# Patient Record
Sex: Male | Born: 1940
Health system: Southern US, Community
[De-identification: ages and names within clinical notes are randomized; demographics above are authoritative.]

## PROBLEM LIST (undated history)

## (undated) DIAGNOSIS — H269 Unspecified cataract: Secondary | ICD-10-CM

## (undated) DIAGNOSIS — R011 Cardiac murmur, unspecified: Secondary | ICD-10-CM

## (undated) DIAGNOSIS — D126 Benign neoplasm of colon, unspecified: Secondary | ICD-10-CM

## (undated) DIAGNOSIS — H3530 Unspecified macular degeneration: Secondary | ICD-10-CM

## (undated) DIAGNOSIS — E785 Hyperlipidemia, unspecified: Secondary | ICD-10-CM

## (undated) DIAGNOSIS — I341 Nonrheumatic mitral (valve) prolapse: Secondary | ICD-10-CM

## (undated) DIAGNOSIS — K635 Polyp of colon: Secondary | ICD-10-CM

## (undated) DIAGNOSIS — I2510 Atherosclerotic heart disease of native coronary artery without angina pectoris: Secondary | ICD-10-CM

## (undated) DIAGNOSIS — K3189 Other diseases of stomach and duodenum: Secondary | ICD-10-CM

## (undated) DIAGNOSIS — I10 Essential (primary) hypertension: Secondary | ICD-10-CM

## (undated) DIAGNOSIS — M545 Low back pain, unspecified: Secondary | ICD-10-CM

## (undated) HISTORY — DX: Low back pain, unspecified: M54.50

## (undated) HISTORY — PX: COLONOSCOPY: SHX174

## (undated) HISTORY — DX: Essential (primary) hypertension: I10

## (undated) HISTORY — DX: Unspecified cataract: H26.9

## (undated) HISTORY — DX: Atherosclerotic heart disease of native coronary artery without angina pectoris: I25.10

## (undated) HISTORY — DX: Other diseases of stomach and duodenum: K31.89

## (undated) HISTORY — DX: Benign neoplasm of colon, unspecified: D12.6

## (undated) HISTORY — DX: Low back pain: M54.5

## (undated) HISTORY — PX: APPENDECTOMY: SHX54

## (undated) HISTORY — DX: Nonrheumatic mitral (valve) prolapse: I34.1

## (undated) HISTORY — DX: Hyperlipidemia, unspecified: E78.5

## (undated) HISTORY — DX: Unspecified macular degeneration: H35.30

## (undated) HISTORY — DX: Polyp of colon: K63.5

## (undated) HISTORY — DX: Cardiac murmur, unspecified: R01.1

---

## 2001-07-16 HISTORY — PX: OTHER SURGICAL HISTORY: SHX169

## 2001-07-16 HISTORY — PX: CARDIAC CATHETERIZATION: SHX172

## 2001-12-13 ENCOUNTER — Emergency Department (HOSPITAL_COMMUNITY): Admission: EM | Admit: 2001-12-13 | Discharge: 2001-12-14 | Payer: Self-pay | Admitting: Emergency Medicine

## 2001-12-13 ENCOUNTER — Encounter: Payer: Self-pay | Admitting: Emergency Medicine

## 2002-05-13 ENCOUNTER — Ambulatory Visit (HOSPITAL_COMMUNITY): Admission: RE | Admit: 2002-05-13 | Discharge: 2002-05-13 | Payer: Self-pay | Admitting: Cardiology

## 2002-05-13 ENCOUNTER — Encounter: Payer: Self-pay | Admitting: Cardiology

## 2002-05-14 ENCOUNTER — Encounter: Payer: Self-pay | Admitting: Cardiology

## 2002-05-14 ENCOUNTER — Ambulatory Visit (HOSPITAL_COMMUNITY): Admission: RE | Admit: 2002-05-14 | Discharge: 2002-05-15 | Payer: Self-pay | Admitting: Cardiology

## 2002-06-17 ENCOUNTER — Encounter: Admission: RE | Admit: 2002-06-17 | Discharge: 2002-06-17 | Payer: Self-pay | Admitting: Internal Medicine

## 2002-06-17 ENCOUNTER — Encounter: Payer: Self-pay | Admitting: Internal Medicine

## 2002-07-16 HISTORY — PX: MITRAL VALVE REPAIR: SHX2039

## 2005-01-18 ENCOUNTER — Ambulatory Visit: Payer: Self-pay | Admitting: Internal Medicine

## 2005-02-14 ENCOUNTER — Ambulatory Visit: Payer: Self-pay | Admitting: Internal Medicine

## 2005-07-19 ENCOUNTER — Ambulatory Visit: Payer: Self-pay | Admitting: Internal Medicine

## 2005-07-27 ENCOUNTER — Ambulatory Visit: Payer: Self-pay | Admitting: Internal Medicine

## 2006-01-15 ENCOUNTER — Encounter: Payer: Self-pay | Admitting: Internal Medicine

## 2006-01-15 ENCOUNTER — Ambulatory Visit: Payer: Self-pay | Admitting: Internal Medicine

## 2006-01-15 ENCOUNTER — Ambulatory Visit: Payer: Self-pay

## 2006-06-14 ENCOUNTER — Ambulatory Visit: Payer: Self-pay | Admitting: Internal Medicine

## 2006-06-14 LAB — CONVERTED CEMR LAB
ALT: 23 units/L (ref 0–40)
AST: 20 units/L (ref 0–37)
Albumin: 4.2 g/dL (ref 3.5–5.2)
Alkaline Phosphatase: 64 units/L (ref 39–117)
BUN: 12 mg/dL (ref 6–23)
Basophils Absolute: 0 10*3/uL (ref 0.0–0.1)
Basophils Relative: 0.8 % (ref 0.0–1.0)
CO2: 32 meq/L (ref 19–32)
Calcium: 9.4 mg/dL (ref 8.4–10.5)
Chloride: 104 meq/L (ref 96–112)
Chol/HDL Ratio, serum: 3
Cholesterol: 148 mg/dL (ref 0–200)
Creatinine, Ser: 0.9 mg/dL (ref 0.4–1.5)
Eosinophil percent: 1.9 % (ref 0.0–5.0)
GFR calc non Af Amer: 90 mL/min
Glomerular Filtration Rate, Af Am: 109 mL/min/{1.73_m2}
Glucose, Bld: 89 mg/dL (ref 70–99)
HCT: 47.1 % (ref 39.0–52.0)
HDL: 48.9 mg/dL (ref >39.0)
Hemoglobin: 15.6 g/dL (ref 13.0–17.0)
LDL Cholesterol: 83 mg/dL (ref 0–99)
Lymphocytes Relative: 26.8 % (ref 12.0–46.0)
MCHC: 33.1 g/dL (ref 30.0–36.0)
MCV: 92.1 fL (ref 78.0–100.0)
Monocytes Absolute: 0.5 10*3/uL (ref 0.2–0.7)
Monocytes Relative: 9.6 % (ref 3.0–11.0)
Neutro Abs: 3.2 10*3/uL (ref 1.4–7.7)
Neutrophils Relative %: 60.9 % (ref 43.0–77.0)
PSA: 0.84 ng/mL (ref 0.10–4.00)
Platelets: 199 10*3/uL (ref 150–400)
Potassium: 4.4 meq/L (ref 3.5–5.1)
RBC: 5.12 M/uL (ref 4.22–5.81)
RDW: 13.1 % (ref 11.5–14.6)
Sodium: 142 meq/L (ref 135–145)
TSH: 1.39 microintl units/mL (ref 0.35–5.50)
Total Bilirubin: 0.8 mg/dL (ref 0.3–1.2)
Total Protein: 6.5 g/dL (ref 6.0–8.3)
Triglyceride fasting, serum: 81 mg/dL (ref 0–149)
VLDL: 16 mg/dL (ref 0–40)
WBC: 5.2 10*3/uL (ref 4.5–10.5)

## 2006-08-12 ENCOUNTER — Ambulatory Visit: Payer: Self-pay | Admitting: Internal Medicine

## 2007-01-22 ENCOUNTER — Telehealth: Payer: Self-pay | Admitting: Internal Medicine

## 2007-01-24 ENCOUNTER — Ambulatory Visit: Payer: Self-pay | Admitting: Internal Medicine

## 2007-01-24 LAB — CONVERTED CEMR LAB
Bilirubin Urine: NEGATIVE
Blood in Urine, dipstick: NEGATIVE
Glucose, Urine, Semiquant: NEGATIVE
Ketones, urine, test strip: NEGATIVE
Nitrite: NEGATIVE
Protein, U semiquant: NEGATIVE
Specific Gravity, Urine: >=1.03
Urobilinogen, UA: NEGATIVE
WBC Urine, dipstick: NEGATIVE
pH: 5

## 2007-01-29 ENCOUNTER — Encounter: Payer: Self-pay | Admitting: Internal Medicine

## 2007-01-30 ENCOUNTER — Ambulatory Visit: Payer: Self-pay | Admitting: Gastroenterology

## 2007-03-26 ENCOUNTER — Ambulatory Visit: Payer: Self-pay | Admitting: Internal Medicine

## 2007-03-26 DIAGNOSIS — M545 Low back pain, unspecified: Secondary | ICD-10-CM | POA: Insufficient documentation

## 2007-03-28 LAB — CONVERTED CEMR LAB: PSA: 1.01 ng/mL (ref 0.10–4.00)

## 2007-04-02 ENCOUNTER — Encounter: Payer: Self-pay | Admitting: Internal Medicine

## 2007-04-02 ENCOUNTER — Encounter: Payer: Self-pay | Admitting: Gastroenterology

## 2007-04-02 ENCOUNTER — Ambulatory Visit: Payer: Self-pay | Admitting: Gastroenterology

## 2007-05-05 ENCOUNTER — Ambulatory Visit: Payer: Self-pay | Admitting: Internal Medicine

## 2007-05-05 LAB — CONVERTED CEMR LAB
ALT: 20 units/L (ref 0–53)
AST: 21 units/L (ref 0–37)
Cholesterol: 135 mg/dL (ref 0–200)
HDL: 45.7 mg/dL (ref >39.0)
LDL Cholesterol: 72 mg/dL (ref 0–99)
Total CHOL/HDL Ratio: 3
Triglycerides: 86 mg/dL (ref 0–149)
VLDL: 17 mg/dL (ref 0–40)

## 2007-07-03 DIAGNOSIS — D126 Benign neoplasm of colon, unspecified: Secondary | ICD-10-CM | POA: Insufficient documentation

## 2007-07-03 DIAGNOSIS — H3530 Unspecified macular degeneration: Secondary | ICD-10-CM | POA: Insufficient documentation

## 2007-07-03 DIAGNOSIS — E785 Hyperlipidemia, unspecified: Secondary | ICD-10-CM | POA: Insufficient documentation

## 2007-09-10 ENCOUNTER — Encounter: Payer: Self-pay | Admitting: Internal Medicine

## 2007-10-17 ENCOUNTER — Ambulatory Visit: Payer: Self-pay | Admitting: Internal Medicine

## 2007-10-17 LAB — CONVERTED CEMR LAB
ALT: 17 units/L (ref 0–53)
AST: 18 units/L (ref 0–37)

## 2007-10-27 ENCOUNTER — Telehealth: Payer: Self-pay | Admitting: Internal Medicine

## 2007-10-30 ENCOUNTER — Ambulatory Visit: Payer: Self-pay | Admitting: Internal Medicine

## 2007-10-30 DIAGNOSIS — I10 Essential (primary) hypertension: Secondary | ICD-10-CM | POA: Insufficient documentation

## 2007-11-01 LAB — CONVERTED CEMR LAB
BUN: 14 mg/dL (ref 6–23)
Basophils Absolute: 0 10*3/uL (ref 0.0–0.1)
Basophils Relative: 0.2 % (ref 0.0–1.0)
CO2: 32 meq/L (ref 19–32)
Calcium: 9.4 mg/dL (ref 8.4–10.5)
Chloride: 108 meq/L (ref 96–112)
Creatinine, Ser: 0.9 mg/dL (ref 0.4–1.5)
Eosinophils Absolute: 0.1 10*3/uL (ref 0.0–0.7)
Eosinophils Relative: 3.1 % (ref 0.0–5.0)
GFR calc Af Amer: 109 mL/min
GFR calc non Af Amer: 90 mL/min
Glucose, Bld: 85 mg/dL (ref 70–99)
HCT: 45.2 % (ref 39.0–52.0)
Hemoglobin: 15.6 g/dL (ref 13.0–17.0)
Lymphocytes Relative: 26.7 % (ref 12.0–46.0)
MCHC: 34.5 g/dL (ref 30.0–36.0)
MCV: 91 fL (ref 78.0–100.0)
Monocytes Absolute: 0.5 10*3/uL (ref 0.1–1.0)
Monocytes Relative: 10.2 % (ref 3.0–12.0)
Neutro Abs: 2.9 10*3/uL (ref 1.4–7.7)
Neutrophils Relative %: 59.8 % (ref 43.0–77.0)
PSA: 0.88 ng/mL (ref 0.10–4.00)
Platelets: 195 10*3/uL (ref 150–400)
Potassium: 4.8 meq/L (ref 3.5–5.1)
RBC: 4.96 M/uL (ref 4.22–5.81)
RDW: 13.5 % (ref 11.5–14.6)
Sodium: 143 meq/L (ref 135–145)
TSH: 1.01 microintl units/mL (ref 0.35–5.50)
WBC: 4.8 10*3/uL (ref 4.5–10.5)

## 2007-11-03 ENCOUNTER — Encounter (INDEPENDENT_AMBULATORY_CARE_PROVIDER_SITE_OTHER): Payer: Self-pay | Admitting: *Deleted

## 2007-11-24 ENCOUNTER — Ambulatory Visit: Payer: Self-pay | Admitting: Cardiology

## 2007-11-24 ENCOUNTER — Ambulatory Visit: Payer: Self-pay

## 2008-01-15 ENCOUNTER — Telehealth (INDEPENDENT_AMBULATORY_CARE_PROVIDER_SITE_OTHER): Payer: Self-pay | Admitting: *Deleted

## 2008-05-03 ENCOUNTER — Ambulatory Visit: Payer: Self-pay | Admitting: Internal Medicine

## 2008-05-03 LAB — CONVERTED CEMR LAB
ALT: 19 units/L (ref 0–53)
AST: 20 units/L (ref 0–37)

## 2008-05-18 ENCOUNTER — Ambulatory Visit: Payer: Self-pay | Admitting: Internal Medicine

## 2008-05-18 LAB — CONVERTED CEMR LAB
Cholesterol: 147 mg/dL (ref 0–200)
HDL: 50.3 mg/dL (ref >39.0)
LDL Cholesterol: 73 mg/dL (ref 0–99)
TSH: 1.41 microintl units/mL (ref 0.35–5.50)
Total CHOL/HDL Ratio: 2.9
Triglycerides: 121 mg/dL (ref 0–149)
VLDL: 24 mg/dL (ref 0–40)

## 2008-10-25 ENCOUNTER — Ambulatory Visit: Payer: Self-pay | Admitting: Internal Medicine

## 2008-11-06 LAB — CONVERTED CEMR LAB
ALT: 21 units/L (ref 0–53)
AST: 20 units/L (ref 0–37)
Cholesterol: 137 mg/dL (ref 0–200)
HDL: 42.4 mg/dL (ref >39.00)
LDL Cholesterol: 80 mg/dL (ref 0–99)
Total CHOL/HDL Ratio: 3
Triglycerides: 73 mg/dL (ref 0.0–149.0)
VLDL: 14.6 mg/dL (ref 0.0–40.0)

## 2008-12-22 ENCOUNTER — Ambulatory Visit: Payer: Self-pay | Admitting: Internal Medicine

## 2008-12-22 DIAGNOSIS — N400 Enlarged prostate without lower urinary tract symptoms: Secondary | ICD-10-CM | POA: Insufficient documentation

## 2008-12-27 LAB — CONVERTED CEMR LAB
BUN: 15 mg/dL (ref 6–23)
Basophils Absolute: 0 10*3/uL (ref 0.0–0.1)
Basophils Relative: 0 % (ref 0.0–3.0)
CO2: 28 meq/L (ref 19–32)
Calcium: 9.1 mg/dL (ref 8.4–10.5)
Chloride: 112 meq/L (ref 96–112)
Creatinine, Ser: 0.9 mg/dL (ref 0.4–1.5)
Eosinophils Absolute: 0.2 10*3/uL (ref 0.0–0.7)
Eosinophils Relative: 3.2 % (ref 0.0–5.0)
GFR calc non Af Amer: 89.27 mL/min (ref >60)
Glucose, Bld: 87 mg/dL (ref 70–99)
HCT: 44 % (ref 39.0–52.0)
Hemoglobin: 14.9 g/dL (ref 13.0–17.0)
Lymphocytes Relative: 29.4 % (ref 12.0–46.0)
Lymphs Abs: 1.5 10*3/uL (ref 0.7–4.0)
MCHC: 33.9 g/dL (ref 30.0–36.0)
MCV: 93 fL (ref 78.0–100.0)
Monocytes Absolute: 0.3 10*3/uL (ref 0.1–1.0)
Monocytes Relative: 6.6 % (ref 3.0–12.0)
Neutro Abs: 3 10*3/uL (ref 1.4–7.7)
Neutrophils Relative %: 60.8 % (ref 43.0–77.0)
PSA: 1.13 ng/mL (ref 0.10–4.00)
Platelets: 185 10*3/uL (ref 150.0–400.0)
Potassium: 4.4 meq/L (ref 3.5–5.1)
RBC: 4.73 M/uL (ref 4.22–5.81)
RDW: 13.2 % (ref 11.5–14.6)
Sodium: 144 meq/L (ref 135–145)
TSH: 1.26 microintl units/mL (ref 0.35–5.50)
WBC: 5 10*3/uL (ref 4.5–10.5)

## 2008-12-28 ENCOUNTER — Telehealth (INDEPENDENT_AMBULATORY_CARE_PROVIDER_SITE_OTHER): Payer: Self-pay | Admitting: *Deleted

## 2008-12-30 ENCOUNTER — Telehealth (INDEPENDENT_AMBULATORY_CARE_PROVIDER_SITE_OTHER): Payer: Self-pay | Admitting: *Deleted

## 2009-02-04 ENCOUNTER — Ambulatory Visit: Payer: Self-pay | Admitting: Internal Medicine

## 2009-02-04 DIAGNOSIS — I2510 Atherosclerotic heart disease of native coronary artery without angina pectoris: Secondary | ICD-10-CM | POA: Insufficient documentation

## 2009-02-04 DIAGNOSIS — I059 Rheumatic mitral valve disease, unspecified: Secondary | ICD-10-CM | POA: Insufficient documentation

## 2009-07-25 ENCOUNTER — Telehealth: Payer: Self-pay | Admitting: Internal Medicine

## 2009-10-24 ENCOUNTER — Ambulatory Visit: Payer: Self-pay | Admitting: Internal Medicine

## 2009-10-25 ENCOUNTER — Encounter: Payer: Self-pay | Admitting: Internal Medicine

## 2009-10-25 LAB — CONVERTED CEMR LAB: AST: 20 units/L (ref 0–37)

## 2009-12-07 ENCOUNTER — Telehealth: Payer: Self-pay | Admitting: Internal Medicine

## 2009-12-26 ENCOUNTER — Ambulatory Visit: Payer: Self-pay | Admitting: Internal Medicine

## 2009-12-26 LAB — CONVERTED CEMR LAB
Bilirubin Urine: NEGATIVE
Blood in Urine, dipstick: NEGATIVE
Glucose, Urine, Semiquant: NEGATIVE
Ketones, urine, test strip: NEGATIVE
Nitrite: NEGATIVE
Protein, U semiquant: NEGATIVE
Specific Gravity, Urine: >=1.03
Urobilinogen, UA: NEGATIVE
WBC Urine, dipstick: NEGATIVE
pH: 6

## 2009-12-27 LAB — CONVERTED CEMR LAB
ALT: 19 units/L (ref 0–53)
AST: 19 units/L (ref 0–37)
Albumin: 4.1 g/dL (ref 3.5–5.2)
Alkaline Phosphatase: 59 units/L (ref 39–117)
BUN: 22 mg/dL (ref 6–23)
Basophils Absolute: 0 10*3/uL (ref 0.0–0.1)
Basophils Relative: 0.5 % (ref 0.0–3.0)
Bilirubin, Direct: 0.1 mg/dL (ref 0.0–0.3)
CO2: 29 meq/L (ref 19–32)
Calcium: 9.2 mg/dL (ref 8.4–10.5)
Chloride: 109 meq/L (ref 96–112)
Creatinine, Ser: 0.8 mg/dL (ref 0.4–1.5)
Eosinophils Absolute: 0.1 10*3/uL (ref 0.0–0.7)
Eosinophils Relative: 2.5 % (ref 0.0–5.0)
GFR calc non Af Amer: 106.56 mL/min (ref >60)
Glucose, Bld: 89 mg/dL (ref 70–99)
HCT: 42.7 % (ref 39.0–52.0)
Hemoglobin: 14.8 g/dL (ref 13.0–17.0)
Lymphocytes Relative: 23.6 % (ref 12.0–46.0)
Lymphs Abs: 1.1 10*3/uL (ref 0.7–4.0)
MCHC: 34.5 g/dL (ref 30.0–36.0)
MCV: 91.4 fL (ref 78.0–100.0)
Monocytes Absolute: 0.5 10*3/uL (ref 0.1–1.0)
Monocytes Relative: 10.1 % (ref 3.0–12.0)
Neutro Abs: 3.1 10*3/uL (ref 1.4–7.7)
Neutrophils Relative %: 63.3 % (ref 43.0–77.0)
PSA: 1.7 ng/mL (ref 0.10–4.00)
Platelets: 184 10*3/uL (ref 150.0–400.0)
Potassium: 5.4 meq/L — ABNORMAL HIGH (ref 3.5–5.1)
RBC: 4.67 M/uL (ref 4.22–5.81)
RDW: 14.8 % — ABNORMAL HIGH (ref 11.5–14.6)
Sodium: 145 meq/L (ref 135–145)
TSH: 0.89 microintl units/mL (ref 0.35–5.50)
Total Bilirubin: 0.8 mg/dL (ref 0.3–1.2)
Total Protein: 6 g/dL (ref 6.0–8.3)
WBC: 4.9 10*3/uL (ref 4.5–10.5)

## 2010-01-19 ENCOUNTER — Telehealth: Payer: Self-pay | Admitting: Internal Medicine

## 2010-08-03 ENCOUNTER — Encounter: Payer: Self-pay | Admitting: Internal Medicine

## 2010-08-03 ENCOUNTER — Other Ambulatory Visit: Payer: Self-pay | Admitting: Internal Medicine

## 2010-08-03 ENCOUNTER — Ambulatory Visit
Admission: RE | Admit: 2010-08-03 | Discharge: 2010-08-03 | Payer: Self-pay | Source: Home / Self Care | Attending: Internal Medicine | Admitting: Internal Medicine

## 2010-08-03 LAB — BASIC METABOLIC PANEL
BUN: 14 mg/dL (ref 6–23)
CO2: 29 mEq/L (ref 19–32)
Calcium: 8.8 mg/dL (ref 8.4–10.5)
Chloride: 101 mEq/L (ref 96–112)
Creatinine, Ser: 0.7 mg/dL (ref 0.4–1.5)
GFR: 118.73 mL/min (ref >60.00)
Glucose, Bld: 89 mg/dL (ref 70–99)
Potassium: 4.6 mEq/L (ref 3.5–5.1)
Sodium: 135 mEq/L (ref 135–145)

## 2010-08-03 LAB — LIPID PANEL
Cholesterol: 150 mg/dL (ref 0–200)
HDL: 48.8 mg/dL (ref >39.00)
LDL Cholesterol: 76 mg/dL (ref 0–99)
Total CHOL/HDL Ratio: 3
Triglycerides: 127 mg/dL (ref 0.0–149.0)
VLDL: 25.4 mg/dL (ref 0.0–40.0)

## 2010-08-03 LAB — AST: AST: 18 U/L (ref 0–37)

## 2010-08-07 ENCOUNTER — Encounter: Payer: Self-pay | Admitting: Internal Medicine

## 2010-08-15 NOTE — Assessment & Plan Note (Signed)
Summary: cpx/cbs   Vital Signs:  Patient profile:   70 year old male Height:      72.25 inches Weight:      221.2 pounds BMI:     29.90 Temp:     98.0 degrees F oral Pulse rate:   60 / minute Resp:     16 per minute BP sitting:   136 / 80  (left arm) Cuff size:   large  Vitals Entered By: Shonna Chock (December 22, 2008 1:52 PM)  Comments REVIEWED MED LIST, PATIENT AGREED DOSE AND INSTRUCTION CORRECT    History of Present Illness: Dr Charlott Rakes 4/10  note & lipids reviewed; stable from cardiac standpoint  Preventive Screening-Counseling & Management  Caffeine-Diet-Exercise     Does Patient Exercise: yes  Allergies: 1)  ! * Altace  Past History:  Past Medical History: MACULAR DEGENERATION, EARLY (ICD-362.50), [facility] , Dr Gwendalyn Ege MITRAL VALVE REPAIRED 2003 COLONIC POLYPS (ICD-211.3) @ 1st colonoscopy; neg X 2 HYPERLIPIDEMIA (ICD-272.4) LOW BACK PAIN, CHRONIC (ICD-724.2)  Past Surgical History: Appendectomy colonoscopy negative 2003, 2008, Dr Alma Downs 2003: 40-60% LAD , cath done pre MV repair;mitral valve repair  04/2002 in [place], [place], Dr Janey Genta  Family History: father cancer on lip, heart disease, prostate cancer mother hypertension?, d @ 42; no sibs; P uncle Macular Degeneration  Social History: Former Smoker quit 1980 Alcohol use-yes socially No diet Occupation: Biomedical scientist Married Regular exercise-yes; 1 mpd 2-3 X/week  Review of Systems  The patient denies anorexia, fever, weight loss, weight gain, decreased hearing, hoarseness, chest pain, syncope, dyspnea on exertion, peripheral edema, prolonged cough, headaches, hemoptysis, abdominal pain, melena, hematochezia, severe indigestion/heartburn, hematuria, depression, unusual weight change, abnormal bleeding, enlarged lymph nodes, and angioedema.         Initial dyspnea with walking which resolves quickly. No PMH of asthma. Derm:  Complains of lesion(s); denies changes in nail beds, dryness, and hair  loss; Lesion RLE had enlarged after he had peeled of dead skin 2 yrs ago; essen stable for 18 months. Dr Donzetta Starch saw it last year.Marland Kitchen  Physical Exam  General:  well-nourished,in no acute distress; alert,appropriate and cooperative throughout examination Head:  Normocephalic and atraumatic without obvious abnormalities. No apparent alopecia Eyes:  No corneal or conjunctival inflammation noted. Perrla. Ears:  External ear exam shows no significant lesions or deformities.  Otoscopic examination reveals clear canals, tympanic membranes are intact bilaterally without bulging, retraction, inflammation or discharge. Hearing is grossly normal bilaterally. Nose:  External nasal examination shows no deformity or inflammation. Nasal mucosa are pink and moist without lesions or exudates. Mouth:  Oral mucosa and oropharynx without lesions or exudates.  Teeth in good repair. Neck:  No deformities, masses, or tenderness noted. Lungs:  Normal respiratory effort, chest expands symmetrically. Lungs are clear to auscultation, no crackles or wheezes. Heart:  Normal rate and regular rhythm. S1 and S2 normal without gallop, murmur, click, rub. S4 with minor slurring Abdomen:  Bowel sounds positive,abdomen soft and non-tender without masses, organomegaly or hernias noted. Rectal:  No external abnormalities noted. Normal sphincter tone. No rectal masses or tenderness. Genitalia:  Testes bilaterally descended without nodularity, tenderness or masses. No scrotal masses or lesions. No penis lesions or urethral discharge. Prostate:  Prostate gland firm and smooth,ULN enlargement, w/o nodularity, tenderness, mass, asymmetry or induration. Msk:  No deformity or scoliosis noted of thoracic or lumbar spine.   Pulses:  R and L carotid,radial,dorsalis pedis and posterior tibial pulses are full and equal bilaterally Extremities:  No clubbing, cyanosis, edema, or deformity noted with normal full range of motion of all joints.   Minor crepitus of knees  Neurologic:  alert & oriented X3 and DTRs symmetrical and normal.   Skin:  9X11 mm granuloma R medial calf Cervical Nodes:  No lymphadenopathy noted Axillary Nodes:  No palpable lymphadenopathy Psych:  memory intact for recent and remote, normally interactive, and good eye contact.     Impression & Recommendations:  Problem # 1:  ROUTINE GENERAL MEDICAL EXAM@HEALTH  CARE FACL (ICD-V70.0)  Orders: Venipuncture (04540) TLB-BMP (Basic Metabolic Panel-BMET) (80048-METABOL) TLB-CBC Platelet - w/Differential (85025-CBCD) TLB-TSH (Thyroid Stimulating Hormone) (84443-TSH) TLB-PSA (Prostate Specific Antigen) (84153-PSA)  Problem # 2:  HYPERTENSION, ESSENTIAL NOS (ICD-401.9)  His updated medication list for this problem includes:    Metoprolol Tartrate 25 Mg Tabs (Metoprolol tartrate) .Marland Kitchen... 1 by mouth two times a day    Amlodipine Besylate 5 Mg Tabs (Amlodipine besylate) .Marland Kitchen... 1 by mouth once daily  Orders: Venipuncture (98119)  Problem # 3:  HYPERLIPIDEMIA (ICD-272.4)  His updated medication list for this problem includes:    Lipitor 80 Mg Tabs (Atorvastatin calcium) .Marland Kitchen... 1 by mouth qd  Orders: Venipuncture (14782)  Problem # 4:  MITRAL VALVE REPLACEMENT, HX OF (ICD-V15.1) as per Dr Tenny Craw  Problem # 5:  COLONIC POLYPS (ICD-211.3) as per Dr Jarold Motto  Problem # 6:  HYPERPLASIA PROSTATE UNS W/O UR OBST & OTH LUTS (ICD-600.90)  Orders: Venipuncture (95621) TLB-PSA (Prostate Specific Antigen) (84153-PSA)  Complete Medication List: 1)  Lipitor 80 Mg Tabs (Atorvastatin calcium) .Marland Kitchen.. 1 by mouth qd 2)  Metoprolol Tartrate 25 Mg Tabs (Metoprolol tartrate) .Marland Kitchen.. 1 by mouth two times a day 3)  Asa 325 Mg I Po Qd  4)  Saw Palmetto  5)  Antioxident For Eyes  6)  Amlodipine Besylate 5 Mg Tabs (Amlodipine besylate) .Marland Kitchen.. 1 by mouth once daily  Patient Instructions: 1)  Make additions & corrections to this record ; save in home file. Prescriptions: METOPROLOL  TARTRATE 25 MG TABS (METOPROLOL TARTRATE) 1 by mouth two times a day  #180 x 3   Entered and Authorized by:   Marga Melnick MD   Signed by:   Marga Melnick MD on 12/22/2008   Method used:   Print then Give to Patient   RxID:   [id number]

## 2010-08-15 NOTE — Progress Notes (Signed)
   Phone Note Call from Patient   Caller: Jeremy Klein Summary of Call: ALEVE for back pain Initial call taken by: J REISS RN  Follow-up for Phone Call        Patient called and informed me that he is seeing Dr.Phil Montez Morita for low back pain and was advised to take Alleve (2 tabs 3 times per day with meals). He wanted to know if Dr.Vergie Zahm was OK with this. Advised him that it would probably be OK but I will check with Dr.Korbyn Vanes and call him back at work tomorrow. ([phone]) Follow-up by: Suzan Garibaldi RN  Additional Follow-up for Phone Call Additional follow up Details #1::        yes that should be ok.  Watch for GI upset. Additional Follow-up by: Sherrill Raring, MD, [facility],  Dec 07, 2009 11:16 AM     Appended Document:  Called patient with above information.

## 2010-08-15 NOTE — Assessment & Plan Note (Signed)
Summary: cpx/tl/see lab 040309/cbs   Vital Signs:  Patient Profile:   70 Years Old Male Weight:      217.38 pounds Pulse rate:   52 / minute Pulse rhythm:   regular Resp:     15 per minute BP sitting:   130 / 82  (left arm) Cuff size:   large  Pt. in pain?   no  Vitals Entered By: Wendall Stade (October 30, 2007 8:37 AM)                   Chief Complaint:  cpx.  History of Present Illness:   He saw Dr. Tenny Craw and had EKG on 10/17/2007 and several labs were done(see printout). Amlodipine added 10/17/07.  General Medical HPI:        Currently he is doing well and is not having any significant new complaints.    Current Allergies (reviewed today): ! * ALTACE  Past Medical History:    Current Problems:     MACULAR DEGENERATION, EARLY (ICD-362.50)    MITRAL VALVE REPAIRED 2003    COLONIC POLYPS (ICD-211.3) @ 1st colonoscopy; neg X 2    HYPERLIPIDEMIA (ICD-272.4)    LOW BACK PAIN, CHRONIC (ICD-724.2)  Past Surgical History:    Appendectomy    colonoscopy negative 2003, 2008    mitral valve repair  04/2002   Family History:    Reviewed history and no changes required:       father cancer on lip, heart disease, prostate cancer       mother hypertension?  Social History:    Reviewed history and no changes required:       Former Smoker quit 1980       Alcohol use-yes social       No diet   Risk Factors:  Tobacco use:  quit    Year quit:  1980 Alcohol use:  yes    Type:  socially Exercise:  yes    Type:  walks dog ;no regular CVE   Review of Systems  General      Denies chills, fatigue, fever, sleep disorder, weakness, and weight loss.  Eyes      Denies blurring, double vision, eye pain, and vision loss-both eyes.      Seen @ [facility] for early MD  ENT      Denies decreased hearing, difficulty swallowing, earache, nasal congestion, ringing in ears, and sinus pressure.  CV      Denies bluish discoloration of lips or nails and leg cramps with  exertion.  Resp      Denies cough, excessive snoring, hypersomnolence, morning headaches, shortness of breath, and sputum productive.  GI      Denies abdominal pain, bloody stools, change in bowel habits, constipation, dark tarry stools, diarrhea, indigestion, nausea, and vomiting.      No dysphagia; colonoscopy < 6 mos ago  GU      Complains of nocturia.      Denies decreased libido, discharge, dysuria, erectile dysfunction, hematuria, and urinary frequency.      X1 @ 4-5 am  MS      Complains of low back pain.      Denies joint pain, joint redness, joint swelling, loss of strength, mid back pain, muscle aches, cramps, muscle weakness, stiffness, and thoracic pain.  Derm      Denies changes in color of skin, changes in nail beds, dryness, excessive perspiration, flushing, hair loss, itching, lesion(s), poor wound healing, and rash.  He will see Dr  Donzetta Starch   Neuro      Denies difficulty with concentration, disturbances in coordination, memory loss, numbness, poor balance, sensation of room spinning, and tingling.  Psych      Denies anxiety, depression, easily angered, easily tearful, and irritability.  Endo      Denies cold intolerance, excessive hunger, excessive thirst, excessive urination, heat intolerance, polyuria, and weight change.  Heme      Denies abnormal bruising and bleeding.  Allergy      Denies itching eyes, seasonal allergies, and sneezing.   Physical Exam  General:     Well-developed,well-nourished,in no acute distress; alert,appropriate and cooperative throughout examination Head:     Normocephalic and atraumatic without obvious abnormalities. No apparent alopecia or balding. Eyes:     No corneal or conjunctival inflammation noted.  Perrla. Funduscopic exam benign, without hemorrhages, exudates or papilledema. Minimal art narrowing Ears:     External ear exam shows no significant lesions or deformities.  Otoscopic examination reveals clear  canals, tympanic membranes are intact bilaterally without bulging, retraction, inflammation or discharge. Hearing is grossly normal bilaterally. Nose:     External nasal examination shows no deformity or inflammation. Nasal mucosa are pink and moist without lesions or exudates. Mouth:     Oral mucosa and oropharynx without lesions or exudates.  Teeth in good repair. Neck:     No deformities, masses, or tenderness noted. Lungs:     Normal respiratory effort, chest expands symmetrically. Lungs are clear to auscultation, no crackles or wheezes. Heart:     Normal rate and regular rhythm. S1 and S2 normal without gallop, murmur, click, rub or other extra sounds.No significant mitral murmur Abdomen:     Bowel sounds positive,abdomen soft and non-tender without masses, organomegaly or hernias noted. Rectal:     Deferred ; recent colonoscopy Genitalia:     Testes bilaterally descended without nodularity, tenderness or masses. No scrotal masses or lesions. No penis lesions or urethral discharge. Prostate:     PSA ordered Msk:     No deformity or scoliosis noted of thoracic or lumbar spine.   Pulses:     R and L carotid,radial,dorsalis pedis and posterior tibial pulses are full and equal bilaterally Extremities:     No clubbing, cyanosis, edema, or deformity noted with normal full range of motion of all joints.  Minor knee crepitus Neurologic:     alert & oriented X3, strength normal in all extremities, gait normal, and DTRs symmetrical and normal.   Skin:     Intact without suspicious lesions or rashes. Minor erythema R temple Cervical Nodes:     No lymphadenopathy noted Axillary Nodes:     No palpable lymphadenopathy Psych:     memory intact for recent and remote, normally interactive, good eye contact, not anxious appearing, and not depressed appearing.      Impression & Recommendations:  Problem # 1:  ROUTINE GENERAL MEDICAL EXAM@HEALTH  CARE FACL (ICD-V70.0)  Orders: TLB-BMP  (Basic Metabolic Panel-BMET) (80048-METABOL) TLB-CBC Platelet - w/Differential (85025-CBCD) TLB-TSH (Thyroid Stimulating Hormone) (84443-TSH) TLB-PSA (Prostate Specific Antigen) (84153-PSA)   Problem # 2:  MITRAL VALVE REPLACEMENT, HX OF (ICD-V15.1) repair not replacement; followed by Dr Tenny Craw  Problem # 3:  HYPERLIPIDEMIA (ICD-272.4) LipoMed pending His updated medication list for this problem includes:    Lipitor 80 Mg Tabs (Atorvastatin calcium) .Marland Kitchen... 1 by mouth qd   Problem # 4:  MACULAR DEGENERATION, EARLY (ICD-362.50)  as per [facility]  Problem # 5:  COLONIC POLYPS (ICD-211.3)  Orders: TLB-CBC Platelet - w/Differential (85025-CBCD)   Problem # 6:  HYPERTENSION, ESSENTIAL NOS (ICD-401.9)  His updated medication list for this problem includes:    Metoprolol Tartrate 50 Mg Tabs (Metoprolol tartrate) .Marland Kitchen... 1/2 tab bid    Amlodipine Besylate 5 Mg Tabs (Amlodipine besylate) .Marland Kitchen... 1 by mouth once daily   Complete Medication List: 1)  Lipitor 80 Mg Tabs (Atorvastatin calcium) .Marland Kitchen.. 1 by mouth qd 2)  Metoprolol Tartrate 50 Mg Tabs (Metoprolol tartrate) .... 1/2 tab bid 3)  Asa 325 Mg I Po Qd  4)  Saw Palmetto  5)  Antioxident For Eyes  6)  Amlodipine Besylate 5 Mg Tabs (Amlodipine besylate) .Marland Kitchen.. 1 by mouth once daily   Patient Instructions: 1)  Annual stool cards & annual PSA.    ]

## 2010-08-15 NOTE — Letter (Signed)
Summary: COLONOSCOPY  COLONOSCOPY   Imported By: Doristine Devoid 04/25/2007 10:54:02  _____________________________________________________________________  External Attachment:    Type:   Image     Comment:   External Document

## 2010-08-15 NOTE — Progress Notes (Signed)
Summary: cream for face  Phone Note Call from Patient Call back at Work Phone [phone]   Caller: Patient Summary of Call: patient called says at last office Dr. Alwyn Ren recommended using cream for his face and he wanted to get name of med Initial call taken by: Doristine Devoid,  January 15, 2008 2:34 PM  Follow-up for Phone Call        DR.HOPPER PLEASE ADVISE Follow-up by: Shonna Chock,  January 15, 2008 3:41 PM  Additional Follow-up for Phone Call Additional follow up Details #1::        Per Dr.Hopper metrogel 30grams 1 by mouth two times a day, patient aware [facility] [place]. Additional Follow-up by: Shonna Chock,  January 15, 2008 5:29 PM    New/Updated Medications: METROGEL 1 %  GEL (METRONIDAZOLE) two times a day as needed   Prescriptions: METROGEL 1 %  GEL (METRONIDAZOLE) two times a day as needed  #30grams x 1   Entered by:   Shonna Chock   Authorized by:   Marga Melnick MD   Signed by:   Shonna Chock on 01/15/2008   Method used:   Electronically sent to ...       [facility] [address]*       [address]       [place], [place]  [address]       Ph: [phone] or [phone]       Fax: [phone]   RxID:   [id number]

## 2010-08-15 NOTE — Progress Notes (Signed)
Summary: NEEDS METORPROLOL TODAY  Phone Note Call from Patient Call back at [phone]   Caller: Patient Reason for Call: Refill Medication Summary of Call: METOPROLOL RX HAS NOT COME BY MAIL ORDER AS OF TODAY & PATIENT IS LEAVING TO GO OUT OF TOWN TODAY.  WILL WE SUBMIT RX TO [facility] [place] TODAY ASAP FOR ENOUGH TO LAST HIM UNTIL HIS MAIL ORDER COMES?  IF PATIENT IS NOT AVAILABLE IT IS OK TO LEAVE INFORMATION W/HIS SECREATARY.   Initial call taken by: Magdalen Spatz [facility],  December 28, 2008 1:55 PM  Follow-up for Phone Call        left message informing pt rx sent to pharmacy.................Marland KitchenFelecia Deloach CMA  December 28, 2008 2:46 PM      Prescriptions: METOPROLOL TARTRATE 25 MG TABS (METOPROLOL TARTRATE) 1 by mouth two times a day  #60 x 0   Entered by:   Jeremy Johann CMA   Authorized by:   Marga Melnick MD   Signed by:   Jeremy Johann CMA on 12/28/2008   Method used:   Faxed to ...       [facility] [address] [facility]* (retail)       [address]       [place], [place]  [address]       Ph: [phone] or [phone]       Fax: [phone]   RxID:   [id number]

## 2010-08-15 NOTE — Progress Notes (Signed)
Summary: Kinlee Garrison-PLEASE REVIEW LABS BY DR ROSS SO DUPLICATION IS NOT MADE  Phone Note Call from Patient Call back at Work Phone [phone] Call back at [phone]   Caller: Patient Summary of Call: HAD AN APPT WITH DR Gunnar Fusi ROSS ON FRIDAY APRIL 3RD - HAD BLOOD WORK COMPLETED AT THAT VISIT AND WANTS TO MAKE SURE WHEN HE COMES IN ON THURS THAT NONE OF THAT LAB WORK IS REPEATED.  IS FINE IF HE NEEDS ADDITIONAL BUT NOT DUPLICATING WHAT HE HAS ALREADY COMPLETED Initial call taken by: Gwen Pounds,  October 27, 2007 10:49 AM  Follow-up for Phone Call        Hopp,  Please see.  ...................................................................Ardyth Man  October 28, 2007 12:41 PM  Follow-up by: Ardyth Man,  October 28, 2007 12:41 PM  Additional Follow-up for Phone Call Additional follow up Details #1::        only labs are  GOT & GPT; therefore fasting lipids, BMET, CBC& dif, TSH, PSA ( V70.00 Additional Follow-up by: Marga Melnick MD,  October 29, 2007 1:10 PM         Appended Document: Jacksyn Beeks-PLEASE REVIEW LABS BY DR ROSS SO DUPLICATION IS NOT MADE CALLED PT TODAY,,,HE WAS IN THE OFFICE FOR LABS, AND PER CHRAE. LABS WERE NOT DUPICATED.  DR ROSS ORDERED A SGOT, SGPT LIVER FUNCTION.Marland KitchenTHIS TEST WAS NOT REPEATED.Marland KitchenMarland KitchenLEFT MESSAGE AT   cdavis04-15-2009

## 2010-08-15 NOTE — Assessment & Plan Note (Signed)
Summary: prostate not better?//tl   Vital Signs:  Patient Profile:   70 Years Old Male Weight:      220.6 pounds Pulse rate:   70 / minute BP sitting:   120 / 80  (left arm)  Pt. in pain?   no  Vitals Entered By: Doristine Devoid (March 26, 2007 11:18 AM)                  Chief Complaint:  prostate issue as discuss on previous visit.  History of Present Illness: Intermittent dull perineal pain w/o radiation. No constitutional sx.  Current Allergies: ! * ALTACE     Review of Systems  General      Denies chills, fever, sweats, and weight loss.  GI      Denies bloody stools, change in bowel habits, constipation, dark tarry stools, and diarrhea.      colonoscopy next week  GU      See HPI      Denies discharge, dysuria, hematuria, incontinence, urinary frequency, and urinary hesitancy.  MS      Complains of low back pain.      after sitting has difficulty ambulating   Physical Exam  General:     Well-developed,well-nourished,in no acute distress; alert,appropriate and cooperative throughout examination Rectal:     No external abnormalities noted. Normal sphincter tone. No rectal masses or tenderness.FOB neg Genitalia:     Testes bilaterally descended without nodularity, tenderness or masses. No scrotal masses or lesions. No penis lesions or urethral discharge. Prostate:     Prostate gland firm and smooth, no enlargement, nodularity, tenderness, mass, asymmetry or induration. Msk:     No deformity or scoliosis noted of thoracic or lumbar spine.  normal ROM.   Neurologic:     strength normal in all extremities, gait normal, and DTRs symmetrical and normal.      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 1 by mouth qd 2)  Metoprolol Tartrate 50 Mg Tabs (Metoprolol tartrate) .... 1/2 tab bid 3)  Asa 325 Mg I Po Qd  4)  Saw Palmetto  5)  Antioxident For Eyes  6)  Cipro 500 Mg Tabs (Ciprofloxacin hcl) .Marland Kitchen.. 1 two times a day   Patient Instructions: 1)  Stretching exercises 3-4X/ week    ]  Appended Document: prostate not better?//tl  Laboratory Results   Urine Tests  Date/Time [facility]ie[facility]: ................................................................[facility]  March 26, 2007 1:32 PM   Routine Urinalysis   Appearance: Clear Glucose: negative   (Normal Range: Negative) Bilirubin: negative   (Normal Range: Negative) Ketone: negative   (Normal Range: Negative) Spec. Gravity: 1.010   (Normal Range: 1.003-1.035) Blood: negative   (Normal Range: Negative) pH: 5.0   (Normal Range: 5.0-8.0) Protein: negative   (Normal Range: Negative) Urobilinogen: negative   (Normal Range: 0-1) Nitrite: negative   (Normal Range: Negative) Leukocyte Esterace: negative   (Normal Range: Negative)

## 2010-08-15 NOTE — Progress Notes (Signed)
   Phone Note Outgoing Call   Call placed by: Oswald Hillock,  July 25, 2009 11:56 AM Summary of Call: [facility] Pt. Lipitor was sent out on 07-21-09 but  not delivered today@ Pt. home. I, called in to [facility]  for 5 tabs of Lipitor and One month supply of Metoprolol Tart.25 mg two times a day( 60 tabs)

## 2010-08-15 NOTE — Assessment & Plan Note (Signed)
Summary: YEARLY//PH   Vital Signs:  Patient profile:   70 year old male Height:      73 inches Weight:      219 pounds BMI:     29.00 Temp:     97.9 degrees F oral Pulse rate:   60 / minute Resp:     16 per minute BP sitting:   118 / 72  (left arm) Cuff size:   large  Vitals Entered By: Shonna Chock (December 26, 2009 9:53 AM) CC: Yearly follow-up with fasting labs , Back pain Comments REVIEWED MED LIST, PATIENT AGREED DOSE AND INSTRUCTION CORRECT  TD Vaccine was updated today, patient will update pneumonia vaccine @ next OV   Primary Care Provider:  Marga Melnick MD  CC:  Yearly follow-up with fasting labs  and Back pain.  History of Present Illness: Jeremy Klein is here for a physical; he has had a flare of LBP. Aleve as per Dr Debria Garret has helped.  The patient denies fever, chills, weakness, loss of sensation, fecal incontinence, urinary incontinence, urinary retention, dysuria, rest pain, inability to work, and inability to care for self.  The pain is located in the mid low back w/o radiation.  The pain began at work; he is  sitting for hours @ computer. The pain is  worse  when he stands up after such inactivity.  The pain is made better by NSAID medications.                                                                                                                                                       Dr Tenny Craw' 10/24/2009 Cardiolgy OV & NMR Lipoprofile were reviewed.  Allergies: 1)  ! * Altace  Past History:  Past Medical History: MACULAR DEGENERATION, EARLY (ICD-362.50), [facility] , Dr Gwendalyn Ege MITRAL VALVE Prolapse, S/P  REPAIR 2003 COLONIC POLYPS (ICD-211.3) @ 1st colonoscopy; negative  X 2 HYPERLIPIDEMIA (ICD-272.4): NMR 10/2009: LDL 68 (1108/908), HDL 48,TG 70. LDL goal = < 85. LOW BACK PAIN, CHRONIC (ICD-724.2)  Past Surgical History: Appendectomy Colonoscopy negative 2003  Dr Jarold Motto Cath 2003: 40-60% LAD , cath done pre MV repair;mitral valve repair  04/2002  in [place], [place], Dr Janey Genta Colon polypectomy 03/2007, tubular adenoma, Dr Jarold Motto, due 2013  Family History: father: cancer of  lip, heart disease, prostate cancer mother: hypertension?, d @ 48; no sibs; P uncle: Macular Degeneration  Social History: Former Smoker: quit 1980 Alcohol use-yes socially No diet Occupation: Biomedical scientist Married Regular exercise-yes: golf , walking 2X /week  Review of Systems  The patient denies anorexia, fever, weight loss, weight gain, vision loss, decreased hearing, hoarseness, chest pain, syncope, dyspnea on exertion, peripheral edema, prolonged cough, headaches, hemoptysis, abdominal pain, melena, hematochezia, severe indigestion/heartburn, hematuria, incontinence, suspicious skin lesions, depression, unusual weight change, abnormal bleeding, enlarged lymph nodes,  and angioedema.    Physical Exam  General:  well-nourished; alert,appropriate and cooperative throughout examination Head:  Normocephalic and atraumatic without obvious abnormalities. No apparent alopecia  Eyes:  No corneal or conjunctival inflammation noted. Perrla. Dr Gwendalyn Ege seen last week Ears:  External ear exam shows no significant lesions or deformities.  Otoscopic examination reveals clear canals, tympanic membranes are intact bilaterally without bulging, retraction, inflammation or discharge. Hearing is grossly normal bilaterally. Nose:  External nasal examination shows no deformity or inflammation. Nasal mucosa are pink and moist without lesions or exudates. Mouth:  Oral mucosa and oropharynx without lesions or exudates.  Teeth in good repair. Neck:  No deformities, masses, or tenderness noted. Lungs:  Normal respiratory effort, chest expands symmetrically. Lungs are clear to auscultation, no crackles or wheezes. Heart:  Normal rate and regular rhythm. S1 and S2 normal without gallop, murmur, click, rub . Abdomen:  Bowel sounds positive,abdomen soft and non-tender without  masses, organomegaly or hernias noted. Rectal:  No external abnormalities noted. Normal sphincter tone. No rectal masses or tenderness. Genitalia:  Testes bilaterally descended without nodularity, tenderness or masses. No scrotal masses or lesions. No penis lesions or urethral discharge. Prostate:  no nodules, no asymmetry, no induration, and 1+ enlarged.   Msk:  No deformity or scoliosis noted of thoracic or lumbar spine.   Pulses:  R and L carotid,radial,dorsalis pedis and posterior tibial pulses are full and equal bilaterally Extremities:  No clubbing, cyanosis, edema,with normal full range of motion of all joints.  Minor flexion contracture 5th % DIP Neurologic:  alert & oriented X3, strength normal in all extremities, and DTRs symmetrical and normal.   Skin:  Intact without suspicious lesions or rashes Cervical Nodes:  No lymphadenopathy noted Axillary Nodes:  No palpable lymphadenopathy Inguinal Nodes:  No significant adenopathy Psych:  memory intact for recent and remote, normally interactive, and good eye contact.     Impression & Recommendations:  Problem # 1:  ROUTINE GENERAL MEDICAL EXAM@HEALTH  CARE FACL (ICD-V70.0)  Orders: Venipuncture (16109) TLB-BMP (Basic Metabolic Panel-BMET) (80048-METABOL) TLB-CBC Platelet - w/Differential (85025-CBCD) TLB-Hepatic/Liver Function Pnl (80076-HEPATIC) TLB-TSH (Thyroid Stimulating Hormone) (84443-TSH) TLB-PSA (Prostate Specific Antigen) (84153-PSA) UA Dipstick w/o Micro (manual) (60454)  Problem # 2:  LOW BACK PAIN, CHRONIC (ICD-724.2)  Problem # 3:  CAD, NATIVE VESSEL (ICD-414.01) as per Dr Tenny Craw His updated medication list for this problem includes:    Metoprolol Tartrate 25 Mg Tabs (Metoprolol tartrate) .Marland Kitchen... 1 by mouth two times a day    Amlodipine Besylate 5 Mg Tabs (Amlodipine besylate) .Marland Kitchen... 1 by mouth once daily  Problem # 4:  HYPERPLASIA PROSTATE UNS W/O UR OBST & OTH LUTS (ICD-600.90)  Orders: TLB-PSA (Prostate Specific  Antigen) (84153-PSA)  Problem # 5:  FAMILY HISTORY OF MALIGNANT NEOPLASM PROSTATE (ICD-V16.42)  Orders: TLB-PSA (Prostate Specific Antigen) (84153-PSA)  Problem # 6:  HYPERTENSION, ESSENTIAL NOS (ICD-401.9)  controlled His updated medication list for this problem includes:    Metoprolol Tartrate 25 Mg Tabs (Metoprolol tartrate) .Marland Kitchen... 1 by mouth two times a day    Amlodipine Besylate 5 Mg Tabs (Amlodipine besylate) .Marland Kitchen... 1 by mouth once daily  Orders: Venipuncture (09811) TLB-BMP (Basic Metabolic Panel-BMET) (80048-METABOL)  Problem # 7:  COLONIC POLYPS (ICD-211.3)  Colonoscopy 2013 as per Dr Jarold Motto  Orders: TLB-CBC Platelet - w/Differential (85025-CBCD)  Complete Medication List: 1)  Lipitor 80 Mg Tabs (Atorvastatin calcium) .Marland Kitchen.. 1 by mouth qd 2)  Metoprolol Tartrate 25 Mg Tabs (Metoprolol tartrate) .Marland Kitchen.. 1 by mouth two times  a day 3)  Asa 325 Mg I Po Qd  4)  Saw Palmetto  5)  Antioxident For Eyes  6)  Amlodipine Besylate 5 Mg Tabs (Amlodipine besylate) .Marland Kitchen.. 1 by mouth once daily  Other Orders: Tdap => 28yrs IM (16109) Admin 1st Vaccine (60454)  Patient Instructions: 1)  Please complete stool cards   Immunizations Administered:  Tetanus Vaccine:    Vaccine Type: Tdap    Site: right deltoid    Mfr: GlaxoSmithKline    Dose: 0.5 ml    Route: IM    Given by: Chrae Malloy    Exp. Date: 10/08/2011    Lot #: [id number]    VIS given: 06/03/07 version given December 26, 2009.  Laboratory Results   Urine Tests   Date/Time Reported: December 26, 2009 11:12 AM   Routine Urinalysis   Color: straw Appearance: Clear Glucose: negative   (Normal Range: Negative) Bilirubin: negative   (Normal Range: Negative) Ketone: negative   (Normal Range: Negative) Spec. Gravity: >=1.030   (Normal Range: 1.003-1.035) Blood: negative   (Normal Range: Negative) pH: 6.0   (Normal Range: 5.0-8.0) Protein: negative   (Normal Range: Negative) Urobilinogen: negative   (Normal Range:  0-1) Nitrite: negative   (Normal Range: Negative) Leukocyte Esterace: negative   (Normal Range: Negative)    Comments: Floydene Flock  December 26, 2009 11:12 AM

## 2010-08-15 NOTE — Progress Notes (Signed)
Summary: pt.lm with no infromation   Pt. called and left message that he would call me back but have not heard from him. Called patient and left message on machine.

## 2010-08-15 NOTE — Progress Notes (Signed)
Summary: non-urgent CALL PT  Phone Note Call from Patient Call back at Home Phone [phone] Call back at [phone]   Caller: Patient Summary of Call: pt states that he would like dr Allyna Pittsley to give him a call to discuss back issue to see if he needs to see dr Kiaja Shorty or ortho. Advise pt that he would  need to be seen by dr Maxi Rodas in order for him to refer him if he has not already been seen. pt states that he would just like for dr Harles Evetts to give him a call at his convenience..........Marland KitchenFelecia Deloach CMA  January 19, 2010 3:23 PM  Initial call taken by: Jeremy Johann CMA,  January 19, 2010 3:20 PM  Follow-up for Phone Call        I reviewed CPX & symptoms reported about back issues. I recommend films of LS spine , meds (see Rxs) & referral to Dr Sheran Luz, Back Specialist (not a Surgeon). Please ask him to review the notes about the back pain & make any corrections or additions; take copy to Dr Ethelene Hal Follow-up by: Marga Melnick MD,  January 20, 2010 4:22 AM    New/Updated Medications: CYCLOBENZAPRINE HCL 5 MG TABS (CYCLOBENZAPRINE HCL) 1-2 at bedtime as needed TRAMADOL HCL 50 MG TABS (TRAMADOL HCL) 1 every 6 hrs as needed back pain Prescriptions: TRAMADOL HCL 50 MG TABS (TRAMADOL HCL) 1 every 6 hrs as needed back pain  #30 x 1   Entered and Authorized by:   Marga Melnick MD   Signed by:   Barnie Mort on 01/23/2010   Method used:   Faxed to ...       [facility]* (retail)       [address]       [place], [place]  [address]       Ph: [phone] or [phone]       Fax: [phone]   RxID:   [id number] CYCLOBENZAPRINE HCL 5 MG TABS (CYCLOBENZAPRINE HCL) 1-2 at bedtime as needed  ##20 x 0   Entered by:   Barnie Mort   Authorized by:   Marga Melnick MD   Signed by:   Barnie Mort on 01/23/2010   Method used:   Faxed to ...       [facility] (retail)       [address]       [place], [place]  [address]       Ph: [phone] or [phone]       Fax: [phone]   RxID:   [id number]   Appended Document: non-urgent CALL PT I spoke with pt and informed pt that meds have been sent in.

## 2010-08-15 NOTE — Assessment & Plan Note (Signed)
Summary: 9 MONTH ROV/SL      Allergies Added:   Visit Type:  Follow-up Primary Provider:  Marga Melnick MD  CC:  no complaints.  History of Present Illness: Patient is a 70 year old with a history of mitral valve prolapse and MR (s/p repair at [facility] in 2003), CAD (moderate of LAD), dyslipidemia.  I last saw him in the summer Since seen, he has done well.   He denies chst pains.  No shortness of breath.  He says he needs to get more active.  And, he is working on cutting back on food intake to lose wt.  Current Medications (verified): 1)  Lipitor 80 Mg  Tabs (Atorvastatin Calcium) .Marland Kitchen.. 1 By Mouth Qd 2)  Metoprolol Tartrate 25 Mg Tabs (Metoprolol Tartrate) .Marland Kitchen.. 1 By Mouth Two Times A Day 3)  Asa 325 Mg I Po Qd 4)  Saw Palmetto 5)  Antioxident For Eyes 6)  Amlodipine Besylate 5 Mg  Tabs (Amlodipine Besylate) .Marland Kitchen.. 1 By Mouth Once Daily  Allergies (verified): 1)  ! * Altace  Past History:  Past medical, surgical, family and social histories (including risk factors) reviewed for relevance to current acute and chronic problems.  Past Medical History: Reviewed history from 01/29/2009 and no changes required. MACULAR DEGENERATION, EARLY (ICD-362.50), [facility] , Dr Gwendalyn Ege MITRAL VALVE Prolapse, s/p REPAIR 2003 COLONIC POLYPS (ICD-211.3) @ 1st colonoscopy; neg X 2 HYPERLIPIDEMIA (ICD-272.4) LOW BACK PAIN, CHRONIC (ICD-724.2)  Past Surgical History: Reviewed history from 12/22/2008 and no changes required. Appendectomy colonoscopy negative 2003, 2008, Dr Jarold Motto  Cth 2003: 40-60% LAD , cath done pre MV repair;mitral valve repair  04/2002 in [place], [place], Dr Janey Genta  Family History: Reviewed history from 12/22/2008 and no changes required. father cancer on lip, heart disease, prostate cancer mother hypertension?, d @ 1; no sibs; P uncle Macular Degeneration  Social History: Reviewed history from 12/22/2008 and no changes required. Former Smoker quit 1980 Alcohol use-yes  socially No diet Occupation: Biomedical scientist Married Regular exercise-yes; 1 mpd 2-3 X/week  Review of Systems       All systems reviewe.  Neg to the above problem  Vital Signs:  Patient profile:   70 year old male Height:      72 inches Weight:      219 pounds Pulse rate:   51 / minute BP sitting:   112 / 71  (left arm) Cuff size:   large  Vitals Entered By: Burnett Kanaris, CNA (October 24, 2009 8:29 AM)  Physical Exam  Additional Exam:  Patient is in NAD> HEENT:  Normocephalic, atraumatic. EOMI, PERRLA.  Neck: JVP is normal. No thyromegaly. No bruits.  Lungs: clear to auscultation. No rales no wheezes.  Heart: Regular rate and rhythm. Normal S1, S2. No S3.   No significant murmurs. PMI not displaced.  Abdomen:  Supple, nontender. Normal bowel sounds. No masses. No hepatomegaly.  Extremities:   Good distal pulses throughout. No lower extremity edema.  Musculoskeletal :moving all extremities.  Neuro:   alert and oriented x3.    EKG  Procedure date:  10/24/2009  Findings:      Sinus bradycardia.  52 bpm.  Impression & Recommendations:  Problem # 1:  MITRAL VALVE DISORDERS (ICD-424.0) On exam no evidence of regurgitation.  Excelllent repair.  Problem # 2:  CAD, NATIVE VESSEL (ICD-414.01) Doing well.  NO symptoms to suggest angina.  Baseline resting heart rate is in the 50s.  this is relatively stable.  He denies shortness of breath,  no dizziness.  I would keep him on the same regimen.  If he notices more fatiguability in the future could back off to see if heart rate is being held back.  Problem # 3:  HYPERLIPIDEMIA (ICD-272.4) Will set up for fasting lipomed and AST.  Other Orders: EKG w/ Interpretation (93000) T-NMR, Lipoprofile ([address]-56213) TLB-AST (SGOT) (84450-SGOT)  Patient Instructions: 1)  Your physician recommends that you schedule a follow-up appointment in: 9-10 MONTHS WITH DR Roxi Hlavaty 2)  Your physician recommends that you return for lab work [id number]  LIPOMED AST 272.4 V58.69 3)  Your physician recommends that you continue on your current medications as directed. Please refer to the Current Medication list given to you today.

## 2010-08-15 NOTE — Progress Notes (Signed)
Summary: need some meds called into local Pharm, called [facility] and Pt.   Phone Note Refill Request Call back at [phone] Message from:  [facility]  Refills Requested: Medication #1:  LIPITOR 80 MG  TABS 1 by mouth qd pls call in about 5pills to help until he gets meds ;from [facility]  Initial call taken by: Omer Jack,  July 25, 2009 10:17 AM  Follow-up for Phone Call        [facility] Lipitor 80 has been shipped  on Thursday 07-21-09. I talked to the Pt. he is waiting for the mail to come today @ about 12 pm, if it not in the mail , he will call back. Follow-up by: Oswald Hillock,  July 25, 2009 11:17 AM

## 2010-08-15 NOTE — Assessment & Plan Note (Signed)
Summary: 9 MONTH ROV/SL    Visit Type:  Follow-up Primary Provider:  Marga Melnick MD  CC:  no complaints.  History of Present Illness: Jeremy Klein is a 70 year old with a hx of CAD, MV disease (s/p repair in 2003), dyslipidemia.  I saw him 9 months ago. Since seen he has done well.  He denies CP, no SOB.  No palpitations, no dizziness.   He walks the dog some.  Is going golfing in [place] next week.  Problems Prior to Update: 1)  Routine General Medical Exam@health  Care Facl  (ICD-V70.0) 2)  Hyperplasia Prostate Uns w/o Ur Obst & Oth Luts  (ICD-600.90) 3)  Family History of Malignant Neoplasm Prostate  (ICD-V16.42) 4)  Hypertension, Essential Nos  (ICD-401.9) 5)  Mitral Valve Replacement, Hx of  (ICD-V15.1) 6)  Macular Degeneration, Early  (ICD-362.50) 7)  Colonic Polyps  (ICD-211.3) 8)  Hyperlipidemia  (ICD-272.4) 9)  Low Back Pain, Chronic  (ICD-724.2)  Medications Prior to Update: 1)  Lipitor 80 Mg  Tabs (Atorvastatin Calcium) .Marland Kitchen.. 1 By Mouth Qd 2)  Metoprolol Tartrate 25 Mg Tabs (Metoprolol Tartrate) .Marland Kitchen.. 1 By Mouth Two Times A Day 3)  Asa 325 Mg I Po Qd 4)  Saw Palmetto 5)  Antioxident For Eyes 6)  Amlodipine Besylate 5 Mg  Tabs (Amlodipine Besylate) .Marland Kitchen.. 1 By Mouth Once Daily  Current Medications (verified): 1)  Lipitor 80 Mg  Tabs (Atorvastatin Calcium) .Marland Kitchen.. 1 By Mouth Qd 2)  Metoprolol Tartrate 25 Mg Tabs (Metoprolol Tartrate) .Marland Kitchen.. 1 By Mouth Two Times A Day 3)  Asa 325 Mg I Po Qd 4)  Saw Palmetto 5)  Antioxident For Eyes 6)  Amlodipine Besylate 5 Mg  Tabs (Amlodipine Besylate) .Marland Kitchen.. 1 By Mouth Once Daily  Allergies: 1)  ! * Altace  Past History:  Past Medical History: Last updated: 01/29/2009 MACULAR DEGENERATION, EARLY (ICD-362.50), [facility] , Dr Gwendalyn Ege MITRAL VALVE Prolapse, s/p REPAIR 2003 COLONIC POLYPS (ICD-211.3) @ 1st colonoscopy; neg X 2 HYPERLIPIDEMIA (ICD-272.4) LOW BACK PAIN, CHRONIC (ICD-724.2)  Past Surgical History: Last updated:  12/22/2008 Appendectomy colonoscopy negative 2003, 2008, Dr Jarold Motto  [facility] 2003: 40-60% LAD , cath done pre MV repair;mitral valve repair  04/2002 in [place], [place], Dr Janey Genta  Family History: Last updated: 12/22/2008 father cancer on lip, heart disease, prostate cancer mother hypertension?, d @ 23; no sibs; P uncle Macular Degeneration  Social History: Last updated: 12/22/2008 Former Smoker quit 1980 Alcohol use-yes socially No diet Occupation: Biomedical scientist Married Regular exercise-yes; 1 mpd 2-3 X/week  Review of Systems       All systems reviewed.  Negative to the  above except as noted.  Vital Signs:  Patient profile:   70 year old male Height:      72 inches Weight:      219 pounds BMI:     29.81 Pulse rate:   50 / minute BP sitting:   136 / 78  (left arm) Cuff size:   large  Vitals Entered By: Burnett Kanaris, CNA (February 04, 2009 8:37 AM)  Physical Exam  Additional Exam:  Patient is in NAD HEENT:  Normocephalic, atraumatic. EOMI, PERRLA.  Neck: JVP is normal. No thyromegaly. No bruits.  Lungs: clear to auscultation. No rales no wheezes.  Heart: Regular rate and rhythm. Normal S1, S2. No S3.   No significant murmurs. PMI not displaced.  Abdomen:  Supple, nontender. Normal bowel sounds. No masses. No hepatomegaly.  Extremities:   Good distal pulses throughout. No lower  extremity edema.  Musculoskeletal :moving all extremities.  Neuro:   alert and oriented x3.    EKG  Procedure date:  02/04/2009  Findings:      Sinus bradycardia.  51 bpm.  Impression & Recommendations:  Problem # 1:  MITRAL VALVE DISORDERS (ICD-424.0) Patient is s/p mitral valve repair at [facility] in 2003.  Exam without murmur to suggest regurg or stenosis.  Has done very well.  Problem # 2:  CAD, NATIVE VESSEL (ICD-414.01) Moderate disease of the LAD at cath in 2003.  Myoview after was normal.  Currently asymptomatic . Continue medical RX. His updated medication list for this problem  includes:    Metoprolol Tartrate 25 Mg Tabs (Metoprolol tartrate) .Marland Kitchen... 1 by mouth two times a day    Amlodipine Besylate 5 Mg Tabs (Amlodipine besylate) .Marland Kitchen... 1 by mouth once daily  Aspirin  Problem # 3:  HYPERTENSION, ESSENTIAL NOS (ICD-401.9) Adequate control.  HR is stable.  Patient asymptomatic. His updated medication list for this problem includes:    Metoprolol Tartrate 25 Mg Tabs (Metoprolol tartrate) .Marland Kitchen... 1 by mouth two times a day    Amlodipine Besylate 5 Mg Tabs (Amlodipine besylate) .Marland Kitchen... 1 by mouth once daily  Problem # 4:  HYPERLIPIDEMIA (ICD-272.4) Recent lipids look good.  Would continue. His updated medication list for this problem includes:    Lipitor 80 Mg Tabs (Atorvastatin calcium) .Marland Kitchen... 1 by mouth qd  Other Orders: EKG w/ Interpretation (93000)  Patient Instructions: 1)  Stay active. 2)  F/U 9 months.

## 2010-08-15 NOTE — Letter (Signed)
Summary: Results Follow up Letter  [facility]  [address] [place], [place] [address]   Phone: [phone]  Fax: [phone]    11/03/2007 MRN: [id number]  Jeremy Klein [address] [place], [place]  [address]  Dear [name],  The following are the results of your recent test(s):  Test         Result    Pap Smear:        Normal _____  Not Normal _____ Comments: ______________________________________________________ Cholesterol: LDL(Bad cholesterol):         Your goal is less than:         HDL (Good cholesterol):       Your goal is more than: Comments:  ______________________________________________________ Mammogram:        Normal _____  Not Normal _____ Comments:  ___________________________________________________________________ Hemoccult:        Normal _____  Not normal _______ Comments:    _____________________________________________________________________ Other Tests:  Please see attached results and comments   We routinely do not discuss normal results over the telephone.  If you desire a copy of the results, or you have any questions about this information we can discuss them at your next office visit.   Sincerely,

## 2010-08-15 NOTE — Assessment & Plan Note (Signed)
Summary: per dr Jakhari Space/cbs   Vital Signs:  Patient Profile:   70 Years Old Male Weight:      220.25 pounds Pulse rate:   64 / minute Pulse rhythm:   regular BP sitting:   132 / 68  (left arm) Cuff size:   large  Pt. in pain?   no  Vitals Entered By: Wendall Stade (January 24, 2007 5:16 PM)                Chief Complaint:  prostate discomfort .  History of Present Illness: lower back pain, no fever,chills or sweats  Current Allergies (reviewed today): ! * ALTACE Updated/Current Medications (including changes made in today's visit):  LIPITOR 80 MG  TABS (ATORVASTATIN CALCIUM) 1 by mouth qd METOPROLOL TARTRATE 50 MG  TABS (METOPROLOL TARTRATE) 1/2 tab bid * ASA 325 MG I PO QD  * SAW PALMETTO  * ANTIOXIDENT FOR EYES  CIPRO 500 MG  TABS (CIPROFLOXACIN HCL) 1 two times a day      Review of Systems  General      Denies chills, fatigue, fever, loss of appetite, malaise, sleep disorder, sweats, weakness, and weight loss.  GI      Denies abdominal pain, bloody stools, change in bowel habits, constipation, dark tarry stools, diarrhea, and gas.  GU      Denies discharge, dysuria, hematuria, incontinence, nocturia, urinary frequency, and urinary hesitancy.  MS      Complains of low back pain and stiffness.      stiffness in back after sitting for a while; not worse with  physical activity; no incontinence;no groin paresthesias but ache in perineum  Neuro      Denies disturbances in coordination, numbness, poor balance, and tingling.   Physical Exam  General:     Well-developed,well-nourished,in no acute distress; alert,appropriate and cooperative throughout examination Rectal:     No external abnormalities noted. Normal sphincter tone. No rectal masses or tenderness.FOB WNL Genitalia:     Testes bilaterally descended without nodularity, tenderness or masses. No scrotal masses or lesions. No penis lesions or urethral discharge. Prostate:     Prostate gland firm and  smooth, no enlargement, nodularity, tenderness, mass, asymmetry or induration. Msk:     No deformity or scoliosis noted of thoracic or lumbar spine.   Neurologic:     DTRs 1/2 L knee;R 1+; SLR neg    Impression & Recommendations:  Problem # 1:  DISORDER, MALE GENITAL NOS (ICD-608.9)  Medications Added to Medication List This Visit: 1)  Lipitor 80 Mg Tabs (Atorvastatin calcium) .Marland Kitchen.. 1 by mouth qd 2)  Metoprolol Tartrate 50 Mg Tabs (Metoprolol tartrate) .... 1/2 tab bid 3)  Asa 325 Mg I Po Qd  4)  Saw Palmetto  5)  Antioxident For Eyes  6)  Cipro 500 Mg Tabs (Ciprofloxacin hcl) .Marland Kitchen.. 1 two times a day  Other Orders: UA Dipstick w/o Micro (81002)     Prescriptions: CIPRO 500 MG  TABS (CIPROFLOXACIN HCL) 1 two times a day  #20 x 0   Entered and Authorized by:   Marga Melnick MD   Signed by:   Marga Melnick MD on 01/24/2007   Method used:   Print then Give to Patient   RxID:   [id number]     Laboratory Results   Urine Tests  Date/Time Recieved: January 24, 2007 5:27 PM  Date/Time Reported: January 24, 2007 5:27 PM   Routine Urinalysis   Color: yellow Appearance: Clear Glucose: negative   (  Normal Range: Negative) Bilirubin: negative   (Normal Range: Negative) Ketone: negative   (Normal Range: Negative) Spec. Gravity: >=1.030   (Normal Range: 1.003-1.035) Blood: negative   (Normal Range: Negative) pH: 5.0   (Normal Range: 5.0-8.0) Protein: negative   (Normal Range: Negative) Urobilinogen: negative   (Normal Range: 0-1) Nitrite: negative   (Normal Range: Negative) Leukocyte Esterace: negative   (Normal Range: Negative)

## 2010-08-15 NOTE — Progress Notes (Signed)
Summary: MED PROBLEM  CHART TO YOU  Phone Note Call from Patient Call back at Home Phone [phone] Call back at Work Phone [phone]   Complaint: Abdominal Pain Summary of Call: PT OF DR. Lorenna Lurry  CALLING ONLY TO SPEAK TO DR. ABOUT HIS MED PROBLEM Initial call taken by: Freddy Jaksch,  January 22, 2007 3:18 PM  Follow-up for Phone Call        [facility] TO CALL RE MED PROBLEM? SPECIFICS? Follow-up by: Kandice Hams,  January 22, 2007 5:29 PM  Additional Follow-up for Phone Call Additional follow up Details #1::        no answer X 2 days  Additional Follow-up by: Marga Melnick MD,  January 24, 2007 10:20 AM    Additional Follow-up for Phone Call Additional follow up Details #2::    [facility] AT WORK TO CALL  PT CALLED PER HOP REQUEST PAGE WHEN CALLED CHAT TO HOP Follow-up by: Kandice Hams,  January 24, 2007 11:02 AM  Additional Follow-up for Phone Call Additional follow up Details #3:: Details for Additional Follow-up Action Taken: OV  Additional Follow-up by: Marga Melnick MD,  January 24, 2007 6:00 PM

## 2010-08-15 NOTE — Letter (Signed)
Summary: RELEASE OF RECORDS  RELEASE OF RECORDS   Imported By: Freddy Jaksch 09/23/2007 16:10:31  _____________________________________________________________________  External Attachment:    Type:   Image     Comment:   External Document

## 2010-08-15 NOTE — Letter (Signed)
Summary: [facility]/REEXAM  [facility]   Imported By: Freddy Jaksch 02/26/2007 09:38:06  _____________________________________________________________________  External Attachment:    Type:   Image     Comment:   External Document

## 2010-08-17 NOTE — Assessment & Plan Note (Signed)
Summary: 9 month rov/sl  Medications Added ASPIRIN 325 MG  TABS (ASPIRIN) 1 tab by mouth once daily * SAW PALMETTO daily * ANTIOXIDENT FOR EYES 1 tab by mouth once daily ALEVE 220 MG TABS (NAPROXEN SODIUM) as needed        Primary Provider:  Marga Melnick MD   History of Present Illness: Patient is a 70 year old with a history of mitral valve prolapse and MR (s/p repair at [facility] in 2003), CAD (moderate of LAD), dyslipidemia.  I saw him in clinc last spring. SInce seen he has done well.  He denies chest pain.  Breathing is OK .  No dizziness. He says he is eatting healthier.    Current Medications (verified): 1)  Lipitor 80 Mg  Tabs (Atorvastatin Calcium) .Marland Kitchen.. 1 By Mouth Qd 2)  Metoprolol Tartrate 25 Mg Tabs (Metoprolol Tartrate) .Marland Kitchen.. 1 By Mouth Two Times A Day 3)  Aspirin 325 Mg  Tabs (Aspirin) .Marland Kitchen.. 1 Tab By Mouth Once Daily 4)  Saw Palmetto .... Daily 5)  Antioxident For Eyes .Marland Kitchen.. 1 Tab By Mouth Once Daily 6)  Amlodipine Besylate 5 Mg  Tabs (Amlodipine Besylate) .Marland Kitchen.. 1 By Mouth Once Daily 7)  Aleve 220 Mg Tabs (Naproxen Sodium) .... As Needed  Allergies: 1)  ! * Altace  Past History:  Family History: Last updated: 12/26/2009 father: cancer of  lip, heart disease, prostate cancer mother: hypertension?, d @ 44; no sibs; P uncle: Macular Degeneration  Social History: Last updated: 12/26/2009 Former Smoker: quit 1980 Alcohol use-yes socially No diet Occupation: Biomedical scientist Married Regular exercise-yes: golf , walking 2X /week  Past medical, surgical, family and social histories (including risk factors) reviewed, and no changes noted (except as noted below).  Past Medical History: MACULAR DEGENERATION, EARLY (ICD-362.50), [facility] , Dr Gwendalyn Ege MITRAL VALVE Prolapse, S/P  REPAIR 2003 COLONIC POLYPS (ICD-211.3) @ 1st colonoscopy; negative  X 2 HYPERLIPIDEMIA (ICD-272.4): NMR 10/2009: LDL 68 (1108/908), HDL 48,TG 70. LDL goal = < 85. LOW BACK PAIN, CHRONIC  (ICD-724.2) CAD  Past Surgical History: Reviewed history from 12/26/2009 and no changes required. Appendectomy Colonoscopy negative 2003  Dr Jarold Motto Cath 2003: 40-60% LAD , cath done pre MV repair;mitral valve repair  04/2002 in [place], [place], Dr Janey Genta Colon polypectomy 03/2007, tubular adenoma, Dr Jarold Motto, due 2013  Family History: Reviewed history from 12/26/2009 and no changes required. father: cancer of  lip, heart disease, prostate cancer mother: hypertension?, d @ 55; no sibs; P uncle: Macular Degeneration  Social History: Reviewed history from 12/26/2009 and no changes required. Former Smoker: quit 1980 Alcohol use-yes socially No diet Occupation: Biomedical scientist Married Regular exercise-yes: golf , walking 2X /week  Review of Systems       All systems reviewed.  Negative to the above problem except as noted above.  Vital Signs:  Patient profile:   70 year old male Height:      73 inches Weight:      219 pounds BMI:     29.00 Pulse rate:   56 / minute Resp:     16 per minute BP sitting:   127 / 77  (right arm)  Vitals Entered By: Kem Parkinson (August 03, 2010 8:17 AM)  Physical Exam  Additional Exam:  Patient is in NAD HEENT:  Normocephalic, atraumatic. EOMI, PERRLA.  Neck: JVP is normal. No thyromegaly. No bruits.  Lungs: clear to auscultation. No rales no wheezes.  Heart: Regular rate and rhythm. Normal S1, S2. No S3.   No  significant murmurs. PMI not displaced.  Abdomen:  Supple, nontender. Normal bowel sounds. No masses. No hepatomegaly.  Extremities:   Good distal pulses throughout. No lower extremity edema.  Musculoskeletal :moving all extremities.  Neuro:   alert and oriented x3.    EKG  Procedure date:  08/03/2010  Findings:      Sinus bradycardia.  51 bpm.  Impression & Recommendations:  Problem # 1:  CAD, NATIVE VESSEL (ICD-414.01) No symptoms to sugg angina.  Encouraged him to stay active  No change in meds.  Problem # 2:   MITRAL VALVE DISORDERS (ICD-424.0) Patient has had an excellent repair of his MV.  NO signs of regurg or stenosis.  Problem # 3:  HYPERTENSION, ESSENTIAL NOS (ICD-401.9) Good control  Continue meds.  Problem # 4:  HYPERLIPIDEMIA (ICD-272.4) Check lipids today.  Patient would like to back off on maximal dose of Lipitor.  This is reasonable.  Will set f/u lipids this summer. He does note that his diet is better.  Other Orders: EKG w/ Interpretation (93000) TLB-BMP (Basic Metabolic Panel-BMET) (80048-METABOL) TLB-Lipid Panel (80061-LIPID) TLB-AST (SGOT) (84450-SGOT)  Patient Instructions: 1)  Your physician recommends that you return for lab work in: lab work today..we will call you with results 2)  Your physician wants you to follow-up in: 9 months  You will receive a reminder letter in the mail two months in advance. If you don't receive a letter, please call our office to schedule the follow-up appointment.

## 2010-08-17 NOTE — Miscellaneous (Signed)
  Clinical Lists Changes  Medications: Removed medication of LIPITOR 80 MG  TABS (ATORVASTATIN CALCIUM) 1 by mouth qd Added new medication of LIPITOR 40 MG TABS (ATORVASTATIN CALCIUM) 1 tab at bedtime - Signed Rx of LIPITOR 40 MG TABS (ATORVASTATIN CALCIUM) 1 tab at bedtime;  #90 x 3;  Signed;  Entered by: Layne Benton, RN, BSN;  Authorized by: Sherrill Raring, MD, [facility];  Method used: Faxed to [facility] [place], , , [place]  , Ph: [phone], Fax: [phone] Rx of METOPROLOL TARTRATE 25 MG TABS (METOPROLOL TARTRATE) 1 by mouth two times a day;  #180 Tablet x 3;  Signed;  Entered by: Layne Benton, RN, BSN;  Authorized by: Sherrill Raring, MD, [facility];  Method used: Faxed to [facility] [place], , , [place]  , Ph: [phone], Fax: [phone]    Prescriptions: METOPROLOL TARTRATE 25 MG TABS (METOPROLOL TARTRATE) 1 by mouth two times a day  #180 Tablet x 3   Entered by:   Layne Benton, RN, BSN   Authorized by:   Sherrill Raring, MD, [facility]   Signed by:   Layne Benton, RN, BSN on 08/07/2010   Method used:   Faxed to ...       [facility] [place] (retail)             , [place]         Ph: [phone]       Fax: [phone]   RxID:   [id number] LIPITOR 40 MG TABS (ATORVASTATIN CALCIUM) 1 tab at bedtime  #90 x 3   Entered by:   Layne Benton, RN, BSN   Authorized by:   Sherrill Raring, MD, [facility]   Signed by:   Layne Benton, RN, BSN on 08/07/2010   Method used:   Faxed to ...       [facility] [place] (retail[facility]             , [place]         Ph: [phone]       Fax: [phone]   RxID:   [id number]

## 2010-11-16 ENCOUNTER — Other Ambulatory Visit: Payer: Self-pay | Admitting: *Deleted

## 2010-11-16 MED ORDER — AMLODIPINE BESYLATE 5 MG PO TABS: 5.0000 mg | ORAL_TABLET | Freq: Every day | ORAL | Status: DC

## 2010-11-28 NOTE — Assessment & Plan Note (Signed)
[facility]                            CARDIOLOGY OFFICE NOTE   Jeremy, [name] CALIBER Klein                     MRN:          [id number]  DATE:05/05/2007                            DOB:          07/04/1941    IDENTIFICATION:  Jeremy Klein is a 70 year old gentleman with a history  of moderate coronary artery disease, mitral valve disease (status post  minimally-invasive repair in 2003, at [facility]), and dyslipidemia.  I last  saw him back in January.   In the interval he has done okay.  He says he has a new dog and he is  walking more, though not what he used to.  His breathing is okay.  He  denies chest pain.   CURRENT MEDICATIONS:  1. Saw palmetto.  2. Metoprolol 25 mg twice daily.  3. ICaps twice daily.  4. Lipitor 80 mg.  5. Aspirin 325 mg, 1/2 tab daily.   PHYSICAL EXAMINATION:  GENERAL:  The patient is in no distress.  VITAL SIGNS:  Blood pressure 147/79, and on my check right arm 148/88,  left arm 150/82, pulse 52 and regular, weight 214 pounds.  This is  relatively stable.  NECK:  JVP is normal.  LUNGS:  Clear without rales.  HEART:  A regular rate and rhythm.  S1 and S2.  No S3, no murmurs  audible.  ABDOMEN:  Benign.  EXTREMITIES:  Good distal pulses, no edema.   A 12-lead electrocardiogram:  Normal sinus bradycardia, rate of 51 beats  per minute.   IMPRESSION:  1. Coronary artery disease, again by catheterization in 2003:  The      patient had a 50%-60% stenosis of the proximal left anterior      descending coronary artery.  A 30% right coronary artery lesion.      Clinically he is doing well on medical therapy.  Again, I would      manage lipids aggressively.  He is to have a fasting lipids today.  2. Mitral valve disease with mitral regurgitation:  He is status post      repair by Dr. Janey Genta.  No evidence of a murmur on examination.      The last echocardiogram done in July 2007, again showed good repair      with only trace mitral  regurgitation.  Ejection fraction on this      echocardiogram was 65%-70%.  Note:  The PA pressure here was 20.  3. Hypertension:  His blood pressure is a little higher today than it      has been and I would like to make sure it is not staying at this      level.  I told him to get his blood pressure checked periodically      at the drug store.  I will also be in touch with Dr. Alwyn Ren.  Jeremy Klein says he was offered a blood pressure medicine a couple of      years ago through Dr. Alwyn Ren but did not start it.  I have on his      last  clinic visit 126/76, 130/80 and 125/79, so this is an      increase.  I encouraged him to increase his activity and pull his      weight down, as this may help.  4. Health care maintenance:  Again we talked about staying active and      decreasing his weight.   FOLLOWUP:  I will set followup for nine months.  Again, will be in touch  with Dr. Alwyn Ren regarding the last blood pressures, and I will be in  touch with the patient as well.   Lipids today.     Pricilla Riffle, MD, [facility]  Electronically Signed    PVR/MedQ  DD: 05/06/2007  DT: 05/06/2007  Job #: [id number]   cc:   Titus Dubin. Alwyn Ren, MD,FACP,FCCP

## 2010-11-28 NOTE — Assessment & Plan Note (Signed)
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 He has had  moderate coronary artery disease, repair of his mitral valve by Dr.  Janey Genta at [facility] in 2003, and hyperlipidemia. He is scheduled  for followup colonoscopy exam for colon polyps going back to 1994. His  last colonoscopy was 5 years ago and it was unremarkable. He has no  gastrointestinal symptoms at this time and is followed closely by Dr.  Alwyn Ren.   He is not on anticoagulants except for;  1. Aspirin 325 mg a day.  2. He takes __________ 25 mg twice a day.  3. Lipitor 80 mg at bedtime.   PAST MEDICAL HISTORY:  Otherwise noncontributory.   FAMILY HISTORY:  Remarkable for prostate cancer in his father and  atherogenesis in his father.   SOCIAL HISTORY:  He is married and lives with his wife. He has a law  degree. He does not smoke or abuse ethanol.   REVIEW OF SYSTEMS:  Noncontributory without any current cardiovascular  or pulmonary complaints.   PHYSICAL EXAMINATION:  He is 6 feet tall and weighs 220 pounds. Blood  pressure is 122/82 and pulse 64 and regular.  General physical exam was not performed.   ASSESSMENT:  Mr. Treinen is due for follow up colonoscopy exam and he  personally would feel better with preoperative antibiotic coverage. He  also relates his cardiac surgeons do not want him to off of aspirin at  any time.   RECOMMENDATIONS:  We will proceed with colonoscopy on salicylate therapy  acknowledging the slightly increased risk if polypectomy has to be done.  We will give him 1 and a half grams of unison before his procedure. He  has no history of penicillin allergy. He is to continue his other  medications as listed above and follow up with Dr.  Alwyn Ren as needed.     Vania Rea. Jarold Motto, MD, [facility], FAGA  Electronically Signed    DRP/MedQ  DD: 01/30/2007  DT: 01/30/2007  Job #: [id number]

## 2010-11-28 NOTE — Assessment & Plan Note (Signed)
[facility]                            CARDIOLOGY OFFICE NOTE   Jeremy, Muellner NACHUM Klein                     MRN:          [id number]  DATE:05/03/2008                            DOB:          Nov 30, 1940    IDENTIFICATION:  Jeremy Klein is a 70 year old gentleman with a history  of mitral valve disease, status post repair in 2003.  The patient also  has moderate CAD with a 50-60% LAD lesion in 2003.  Myoview in 2004  showed no ischemia.  In addition, he has a history of dyslipidemia and  hypertension.  I last saw him in April.   In the interval, he has done fine.  He is walking some with his dog.  Denies chest pain.  No significant shortness of breath.   CURRENT MEDICINES:  1. Saw palmetto capsules b.i.d.  2. Aspirin 325.  3. Crestor 20.  4. Norvasc 5.  5. Metoprolol 25 daily.   PHYSICAL EXAMINATION:  GENERAL:  The patient is in no distress.  VITAL SIGNS:  Blood pressure is 133/77, pulse 51, and weight 213, which  is down 6 pounds from previous.  NECK:  No bruits.  LUNGS:  Clear.  No rales.  CARDIAC:  Regular rate and rhythm.  S1 and S2.  No S3 and no murmurs.  ABDOMEN:  Benign.  EXTREMITIES:  Good pulses.  No edema.   A 12-lead EKG - sinus bradycardia at 51 beats per minute.   IMPRESSION:  1. Mitral valve disease.  No evidence of murmur on exam.  No evidence      of volume overload.  2. Coronary artery disease, does not appear to show any signs of      ischemia.  Continue on medical therapy.  3. Dyslipidemia, fasting lipids this morning.  4. Hypertension, doing well on current regimen would continue.   I again encouraged him to stay active and walk.  Try to get his weight  down and watch what he eats.  I will be in touch with him once I have  seen the blood work.  Followup plan in about 8 months.     Pricilla Riffle, MD, [facility]  Electronically Signed    PVR/MedQ  DD: 05/03/2008  DT: 05/04/2008  Job #: [id number]   cc:   Jeremy Klein. Jeremy Ren,  MD,FACP,FCCP

## 2010-11-28 NOTE — Assessment & Plan Note (Signed)
[facility]                            CARDIOLOGY OFFICE NOTE   Jeremy Klein, Jeremy Klein                     MRN:          [id number]  DATE:10/17/2007                            DOB:          06/26/1941    IDENTIFICATION:  Jeremy Klein is a 70 year old gentleman who I follow in  clinic.  I last saw him back in October.  He has a history of mitral  valve disease, status post repair in 2003; coronary artery disease (50%  to 60% LAD lesion), nonischemic Myoview in 2004; dyslipidemia and  hypertension.   Since seen, he has done okay.  He denies chest pain.  No significant  shortness of breath.  He is not walking much; he takes his dog out for  short walks.   CURRENT MEDICATIONS:  1. Saw palmetto daily.  2. Metoprolol 25 b.i.d.  3. ICaps b.i.d.  4. Lipitor 80.  5. Aspirin 325.   PHYSICAL EXAM:  The patient is in no acute distress.  Blood pressure is 134/85; on my check, 150/80 left arm, 145/90 right  arm.  Pulse is 51 and regular.  Weight 219, up 5 pounds from previous.  LUNGS:  Clear.  CARDIAC:  Regular rate and rhythm, S1-S2, no S3, no murmurs.  ABDOMEN:  Supple, no masses.  Question bruit.  EXTREMITIES:  Good distal pulses.  No edema.   TWELVE-LEAD EKG:  Normal sinus, bradycardia of 53 beats per minute,  nonspecific ST changes.   IMPRESSION:  1. History of mitral valve disease, status post repair.  He had      minimally invasive surgery done at [facility] and has      done well.  Last echocardiogram in July 2007 showed trivial mitral      regurgitation.  2. Coronary artery disease, asymptomatic.  Continue risk factor      modification.  3. Dyslipidemia.  We will set the patient up for LipoMed.  4. Hypertension.  I would like to get better control of his blood      pressure; it was a little lower when coming in, but with talking,      it has gone up.  I would add 2.5 mg of Norvasc to his regimen and      follow.  5. Health care  maintenance:  I will set the patient up for abdominal      ultrasound to evaluate the abdominal aorta.  I spent time talking      to the patient today about exercise and weight loss.  I encouraged      him to increase his physical activity.   The patient will be set up for abdominal ultrasound; at that time, he  will have a little blood pressure check in a few weeks.  Although I see  him back in 6 months' time, I will be in touch with him regarding the  blood work and test results.     Pricilla Riffle, MD, [facility]  Electronically Signed    PVR/MedQ  DD: 10/19/2007  DT: 10/20/2007  Job #: [id number]   cc:  Darrick Penna. Linna Darner, MD,FACP,FCCP

## 2010-12-01 NOTE — Discharge Summary (Signed)
NAME:  Jeremy Klein, Jeremy Klein NO.:  [id number]   MEDICAL RECORD NO.:  [id number]                   PATIENT TYPE:  OIB   LOCATION:  2007                                 FACILITY:  [facility]   PHYSICIAN:  Pricilla Riffle, M.D. [facility]             DATE OF BIRTH:  15-Nov-1940   DATE OF ADMISSION:  05/14/2002  DATE OF DISCHARGE:  05/15/2002                                 DISCHARGE SUMMARY   DISCHARGE DIAGNOSES:  1. Moderate to severe mitral regurgitation with prolapse of the posterior     leaflet and mitral valve.  2. Hyperlipidemia.   HISTORY OF PRESENT ILLNESS:  The patient is a 70 year old male patient of  Quita Skye. Hart Rochester, M.D.  TEE done on May 13, 2002, showed a partially  frail posterior leaflet in the middle cusp.  It was graded as severe mitral  regurgitation with a PAP of 75-80.  Because of the patient's increasing  dyspnea, he was admitted for cardiac catheterization.   HOSPITAL COURSE:  On May 14, 2002, the patient was taken to the  catheterization lab by Jonelle Sidle, M.D.  Catheterization showed  moderate coronary artery disease of 50-60% in the proximal portion of the  LAD.  In the circumflex, there was no flow-limiting coronary artery disease.  In the right coronary artery, there was a 30% lesion in the mid to distal  vessel.  The left ventriculogram showed no wall motion abnormalities and an  ejection fraction of 55% with 3+ mitral regurgitation.  There was also found  to be severe pulmonary hypertension with a PAP of 60 mmHg.   With the patient's coronary anatomy and heart pressures known, Pricilla Riffle, M.D., arranged for transfer the following day to [facility] under the care of Dr. Jason Fila for valve replacement/repair.   The next day, the patient was doing well, had no chest pain or shortness of  breath, and his groin was stable.  He was felt to be ready for discharge.   DISCHARGE MEDICATIONS:  1. Pravachol 20 mg  q.h.s.  2. Vitamins as previously taken.   LABORATORY DATA:  White count 6.5, hemoglobin 14.9, hematocrit 43.1,  platelets 176.  Sodium 128, potassium 4.8, chloride 102, CO2 29, BUN 10,  creatinine 1.0, glucose 29.  BNP 80.2.  Magnesium 2.2.   ACTIVITY:  The patient was instructed to avoid driving, heavy lifting, and  tub baths for two days.   DIET:  He is to follow a low-fat, low-cholesterol diet.   WOUND CARE:  He is to watch the catheterization site for any pain, bleeding,  or swelling and to call either the [place] office or contact Dr. Jason Fila.    FOLLOW-UP:  He is to follow up with Titus Dubin. Alwyn Ren, M.D., as needed or  scheduled.  He is to follow up with Pricilla Riffle, M.D., after surgery.     Annett Fabian,  P.A. [facility]                  Pricilla Riffle, M.D. [facility]    CKM/MEDQ  D:  05/15/2002  T:  05/15/2002  Job:  [id number]   cc:   Titus Dubin. Alwyn Ren, M.D. [facility]   Pricilla Riffle, M.D. [facility]   Dr. Ambrose Pancoast, [place]

## 2010-12-01 NOTE — Cardiovascular Report (Signed)
NAME:  Jeremy Klein, DUCHEMIN NO.:  [id number]   MEDICAL RECORD NO.:  [id number]                   PATIENT TYPE:  OIB   LOCATION:  2007                                 FACILITY:  [facility]   PHYSICIAN:  Jonelle Sidle, M.D. [facility]        DATE OF BIRTH:  27-Oct-1940   DATE OF PROCEDURE:  05/14/2002  DATE OF DISCHARGE:                              CARDIAC CATHETERIZATION   [place] CARDIOLOGIST:  Pricilla Riffle, M.D.   INDICATIONS FOR PROCEDURE:  The patient is a pleasant 70 year old male with  severe mitral regurgitation with a partially flailed posterior mitral valve  leaflet associated with severe pulmonary hypertension estimated at 75-80  mmHg by transesophageal echocardiogram recently.  The patient presents as  part of an evaluation for surgical repair.  Coronary angiography is  requested to evaluate for the presence of coronary artery disease.  Additional hemodynamic measurements are also requested.   PROCEDURE PERFORMED:  1. Left heart catheterization.  2. Right heart catheterization.  3. Selective coronary angiography.  4. Left ventriculography.   ACCESS AND EQUIPMENT:  The area about the right femoral artery and vein was  anesthetized with 1% lidocaine and a 6-French sheath was placed in the right  femoral artery via the modified Seldinger technique.  An 8-French sheath was  placed in the right femoral vein via the modified Seldinger technique.  A  7.5-French balloon-tipped, flow-directed pulmonary artery catheter was  advanced in the standard fashion to obtain hemodynamic measurements/right  heart pressures.   Standard preformed 6-French JL4 and JR4 catheters were used for selective  coronary angiography and a 6-French angled pigtail catheter was used for  left heart catheterization and left ventriculography.  All exchanges were  made over a wire and the patient tolerated the procedure well without  immediate complications.   HEMODYNAMICS:  1.  Right atrium:  11 artery, 9 vein, 7 mean.  2. Right ventricle:  66/8.  3. Pulmonary artery:  66/26 with a mean of 42.  4. Pulmonary capillary wedge pressure:  42/51 (V wave) with a mean of 36.  5. Left ventricle:  110/20.  6. Aorta:  114/66.  7. Cardiac output 4.0 by the Fick method and 5.5 by the thermodilution.     Cardiac index 1.8 by the Fick method and 2.5 by the thermodilution     method.  8. Arterial saturation 94%.  9. Pulmonary artery saturation 63%.   ANGIOGRAPHIC FINDINGS:  1. The left main coronary artery has only minor luminal irregularities with     no significant flow-limiting coronary atherosclerosis.  2. The left anterior descending artery is a large caliber vessel that has a     very high large diagonal branch versus ramus intermedius branch that     portends much of the lateral wall and bifurcates.  There are 50-60%     proximal stenoses near the ostium of the left anterior descending as well     as more  in the proximal vessel.  Other minor luminal irregularities are     also noted.  3. The circumflex coronary artery is a medium caliber vessel that stays     mainly in the AV groove and has only minor irregularities without flow-     limiting coronary artery disease.  4. The right coronary artery is a large dominant vessel that has 30%     stenoses in the mid to distal segment with no significant flow-limiting     lesions.  5. Left ventriculography is performed in the RAO projection and reveals an     ejection fraction of 55% with no focal wall motion abnormalities and 3+     mitral regurgitation.   DIAGNOSES:  1. Moderate coronary atherosclerosis with 50-60% stenoses in the proximal     left anterior descending artery and other minor luminal irregularities as     outlined.  2. Left ventricular ejection fraction approximately 55%.  3. Mitral regurgitation, 3+.  4. Significant pulmonary hypertension with a pulmonary artery pressure     measured at 61 mmHg.  5.  A differential is noted between the pulmonary capillary wedge pressure     mean and left ventricular end-diastolic pressure with a calculated mitral     valve area of 0.9 although this is inconsistent with recent     transesophageal echocardiographic findings.   RECOMMENDATIONS:  I have discussed the results with Dr. Tenny Craw who has  contacted Dr. Johnette Abraham at [facility].  We will plan  to admit the patient overnight and discuss transfer for considerations  regarding mitral valve repair versus replacement and options for potential  bypass grafting of the left anterior descending.  :                                               Jonelle Sidle, M.D. [facility]    SGM/MEDQ  D:  05/14/2002  T:  05/14/2002  Job:  [id number]

## 2010-12-01 NOTE — Assessment & Plan Note (Signed)
[facility]                            CARDIOLOGY OFFICE NOTE   [name], [name] OSHER [name]                     MRN:          [id number]  DATE:08/12/2006                            DOB:          05-04-1941    August 12, 2006   IDENTIFICATION:  Jeremy Klein is a 70 year old gentleman last seen in  July.  He has a history of moderate CAD, mitral valve disease (status  post repair, minimally invasive in 2003 at [facility]) and  dyslipidemia.   Since seen, he has been doing well.  He denies shortness of breath, no  chest pressure.  He is walking but not as much as he says he wants to.  Still says he needs to watch what he eats more.   CURRENT MEDICATIONS:  1. Saw palmetto daily.  2. Metoprolol 25 b.i.d.  3. Lipitor 80 nightly.  4. Aspirin 325 daily.   PHYSICAL EXAMINATION:  GENERAL APPEARANCE:  Patient is in no distress.  VITAL SIGNS:  Blood pressure 130/80, pulse 57 and regular, weight 218 up  from 213 on last visit.  NECK:  JVP is normal.  No bruits, no thyromegaly.  LUNGS:  Clear.  No rales.  CARDIOVASCULAR:  Regular rate and rhythm.  S1 and S2, no S3, no murmurs.  ABDOMEN:  Benign.  Slightly obese.  EXTREMITIES:  There are 2+ pulses, no edema.   A 12-lead EKG shows normal sinus bradycardia at a rate of 57 beats per  minute with occasional PVC.   LABORATORY DATA:  Done by Titus Dubin. Alwyn Ren, MD, in November.  Hemoglobin 15.6, potassium 4.4.  Total cholesterol 148, triglycerides  81, LDL 83, HDL 49.   IMPRESSION:  1. Coronary artery disease.  Clinically appears to be doing well.  I      encourage him to walk more.  No concerns right now for angina.  2. Mitral valve disease.  Again on exam, there is no hint of mitral      regurgitation.  Will follow.  Indeed an echo from July mean      gradient across the valve was 3, not convinced that there was no      evidence for any narrowing either.  3. Dyslipidemia.  I would keep him on the  current dose of Lipitor.      Again I think if he pulled his weight down, the lipids would      improve and was shown this actually in the past.   I will set to see him in November, sooner if problems develop.  Again,  given him refill prescriptions for his medications.     Pricilla Riffle, MD, [facility]  Electronically Signed    PVR/MedQ  DD: 08/12/2006  DT: 08/12/2006  Job #: [id number]   cc:   Titus Dubin. Alwyn Ren, MD,FACP,FCCP

## 2011-03-07 ENCOUNTER — Ambulatory Visit (INDEPENDENT_AMBULATORY_CARE_PROVIDER_SITE_OTHER): Payer: Federal, State, Local not specified - PPO | Admitting: Internal Medicine

## 2011-03-07 ENCOUNTER — Encounter: Payer: Self-pay | Admitting: Internal Medicine

## 2011-03-07 ENCOUNTER — Ambulatory Visit (INDEPENDENT_AMBULATORY_CARE_PROVIDER_SITE_OTHER)
Admission: RE | Admit: 2011-03-07 | Discharge: 2011-03-07 | Disposition: A | Discharge status: Home / Self Care | Payer: Federal, State, Local not specified - PPO | Source: Ambulatory Visit | Attending: Internal Medicine | Admitting: Internal Medicine

## 2011-03-07 DIAGNOSIS — E785 Hyperlipidemia, unspecified: Secondary | ICD-10-CM

## 2011-03-07 DIAGNOSIS — Z2911 Encounter for prophylactic immunotherapy for respiratory syncytial virus (RSV): Secondary | ICD-10-CM

## 2011-03-07 DIAGNOSIS — S20219A Contusion of unspecified front wall of thorax, initial encounter: Secondary | ICD-10-CM

## 2011-03-07 DIAGNOSIS — R55 Syncope and collapse: Secondary | ICD-10-CM

## 2011-03-07 DIAGNOSIS — Z23 Encounter for immunization: Secondary | ICD-10-CM

## 2011-03-07 DIAGNOSIS — I10 Essential (primary) hypertension: Secondary | ICD-10-CM

## 2011-03-07 DIAGNOSIS — S199XXA Unspecified injury of neck, initial encounter: Secondary | ICD-10-CM

## 2011-03-07 DIAGNOSIS — S0993XA Unspecified injury of face, initial encounter: Secondary | ICD-10-CM

## 2011-03-07 LAB — CBC WITH DIFFERENTIAL/PLATELET
Basophils Absolute: 0 10*3/uL (ref 0.0–0.1)
Basophils Relative: 0.3 % (ref 0.0–3.0)
Eosinophils Absolute: 0.1 10*3/uL (ref 0.0–0.7)
Eosinophils Relative: 2.9 % (ref 0.0–5.0)
HCT: 45.7 % (ref 39.0–52.0)
Hemoglobin: 15.1 g/dL (ref 13.0–17.0)
Lymphocytes Relative: 17.4 % (ref 12.0–46.0)
Lymphs Abs: 0.8 10*3/uL (ref 0.7–4.0)
MCHC: 33 g/dL (ref 30.0–36.0)
MCV: 92.6 fl (ref 78.0–100.0)
Monocytes Absolute: 0.5 10*3/uL (ref 0.1–1.0)
Monocytes Relative: 11.1 % (ref 3.0–12.0)
Neutro Abs: 3.3 10*3/uL (ref 1.4–7.7)
Neutrophils Relative %: 68.3 % (ref 43.0–77.0)
Platelets: 198 10*3/uL (ref 150.0–400.0)
RBC: 4.93 Mil/uL (ref 4.22–5.81)
RDW: 14.6 % (ref 11.5–14.6)
WBC: 4.8 10*3/uL (ref 4.5–10.5)

## 2011-03-07 LAB — LIPID PANEL
Cholesterol: 164 mg/dL (ref 0–200)
HDL: 53.7 mg/dL (ref >39.00)
LDL Cholesterol: 86 mg/dL (ref 0–99)
Total CHOL/HDL Ratio: 3
Triglycerides: 120 mg/dL (ref 0.0–149.0)
VLDL: 24 mg/dL (ref 0.0–40.0)

## 2011-03-07 LAB — BASIC METABOLIC PANEL
BUN: 16 mg/dL (ref 6–23)
CO2: 30 mEq/L (ref 19–32)
Calcium: 9 mg/dL (ref 8.4–10.5)
Chloride: 105 mEq/L (ref 96–112)
Creatinine, Ser: 0.7 mg/dL (ref 0.4–1.5)
GFR: 116.6 mL/min (ref >60.00)
Glucose, Bld: 97 mg/dL (ref 70–99)
Potassium: 4.6 mEq/L (ref 3.5–5.1)
Sodium: 141 mEq/L (ref 135–145)

## 2011-03-07 LAB — HEPATIC FUNCTION PANEL
ALT: 15 U/L (ref 0–53)
AST: 16 U/L (ref 0–37)
Albumin: 4.2 g/dL (ref 3.5–5.2)
Alkaline Phosphatase: 60 U/L (ref 39–117)
Bilirubin, Direct: 0 mg/dL (ref 0.0–0.3)
Total Bilirubin: 0.8 mg/dL (ref 0.3–1.2)
Total Protein: 6.6 g/dL (ref 6.0–8.3)

## 2011-03-07 LAB — TSH: TSH: 1.19 u[IU]/mL (ref 0.35–5.50)

## 2011-03-07 IMAGING — CR DG CHEST 2V
2 series · 2 of 2 positions shown · non-contrast
Comparison: None

CLINICAL DATA: Syncope.  Left chest injury.

CHEST - 2 VIEW

[view not recorded (1 of 2)]
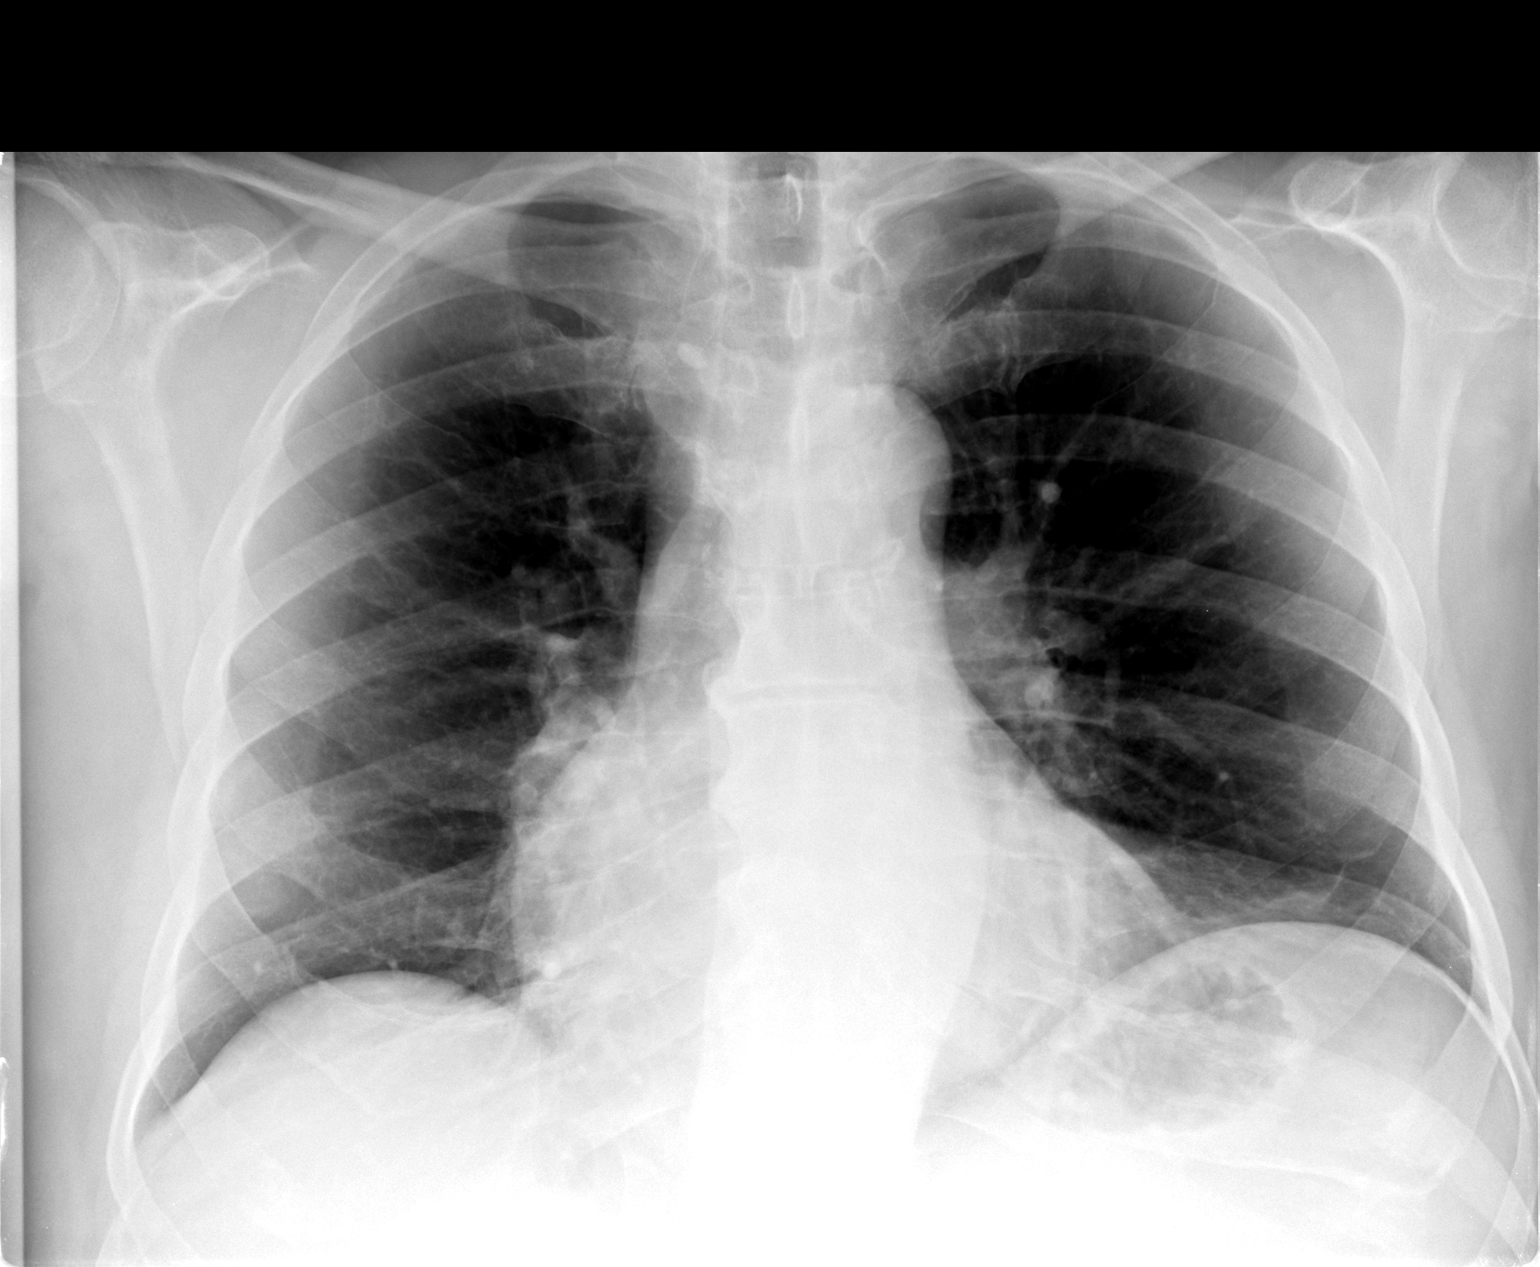

[view not recorded (2 of 2)]
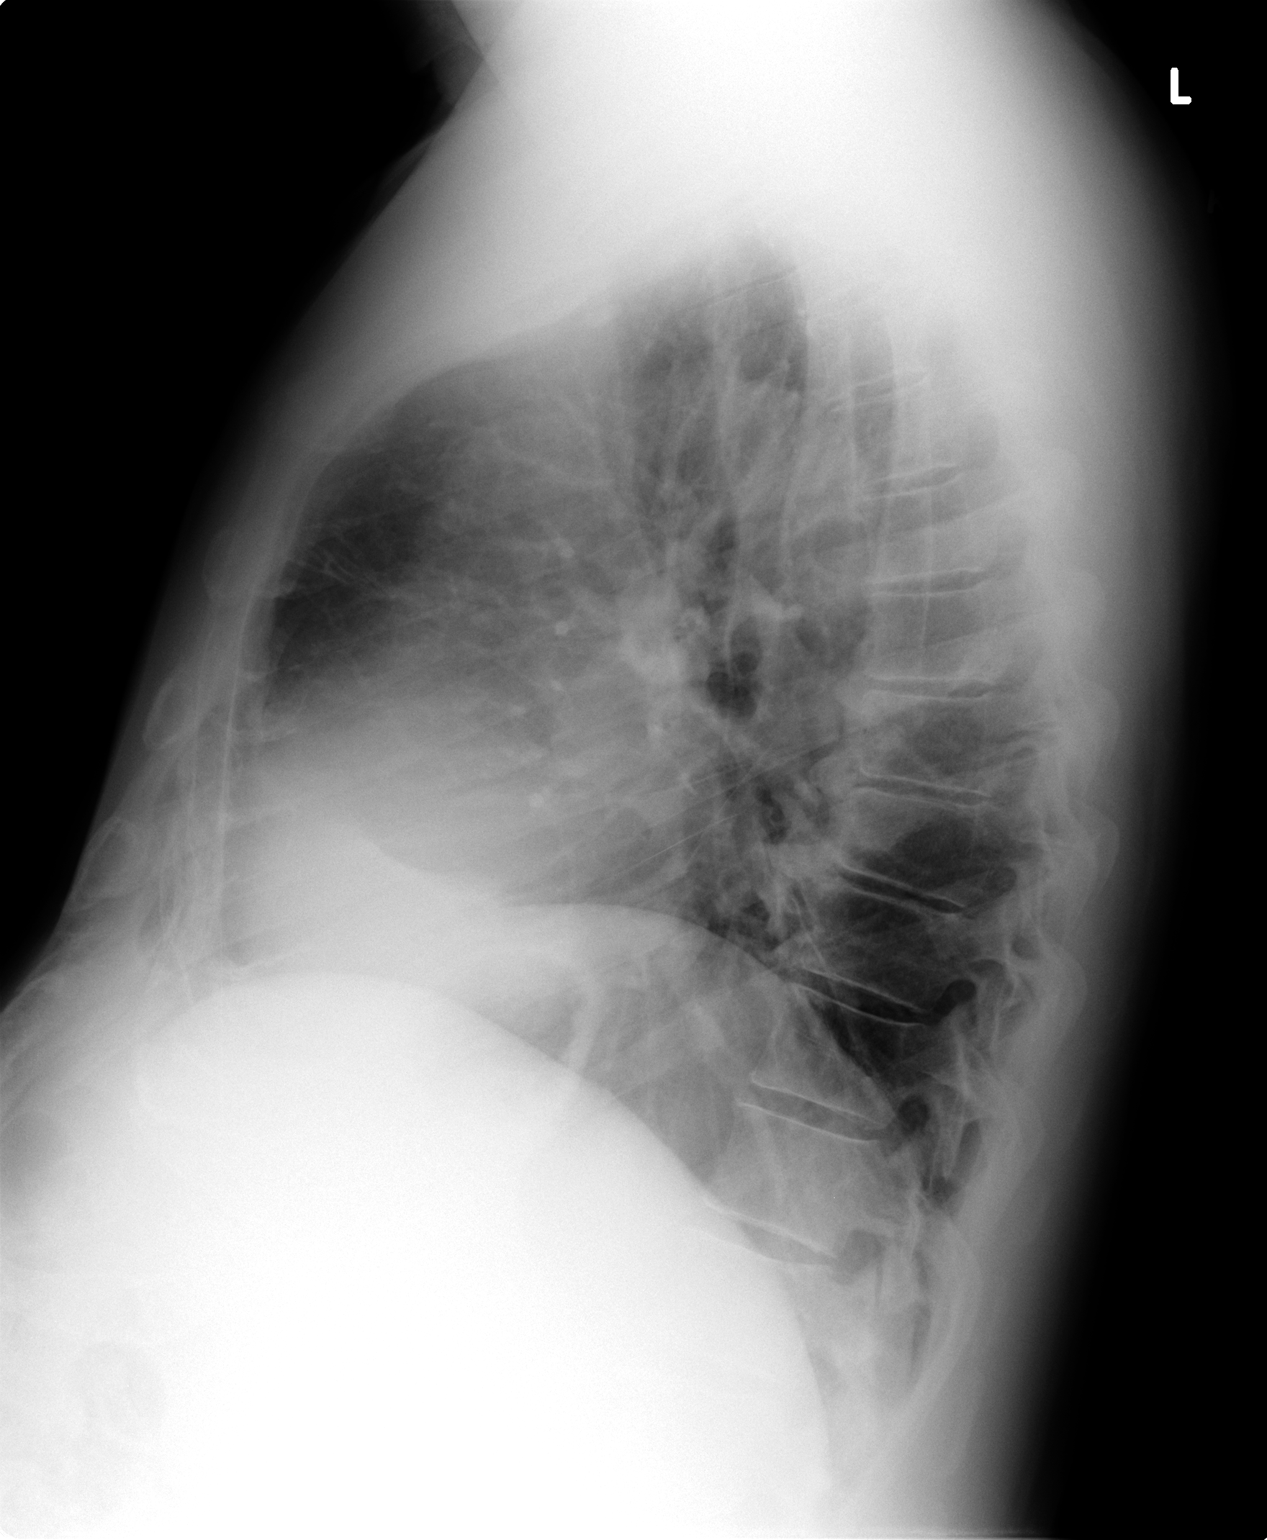

[2 of 2 positions shown; findings below may reference images not displayed]

FINDINGS: The heart size and mediastinal contours are within normal
limits. The lung volumes appear low.  Scar versus plate-like
atelectasis is noted in the left base.

Both lungs are clear.  The visualized skeletal structures are
unremarkable.
IMPRESSION: No acute cardiopulmonary abnormalities.

## 2011-03-07 NOTE — Patient Instructions (Signed)
Repeat the isometric exercises discussed 4- 5 times prior to standing if you've been seated for a period of time.  Decrease the metoprolol to 25 mg one half pill twice a day until your followup with your  cardiologist.

## 2011-03-07 NOTE — Progress Notes (Signed)
Addended by: Doristine Devoid on: 03/07/2011 03:30 PM   Modules accepted: Orders

## 2011-03-07 NOTE — Progress Notes (Signed)
Subjective:    Patient ID: Jeremy Klein, male    DOB: 06/01/1941, 70 y.o.   MRN: [id number]  HPI Syncope  :   Onset: 02/28/2011   .  Context: position change : after standing up; this has recurred in past , "once every 2-3 months after sitting for period"  no straining;  no pain LOC/ Duration : seconds  .   Cardiac Prodrome: no  heart racing/ heart irregularity/ palpitations  Neuro Prodrome: no headache; numbness & tingling;weakness  Vertigo: no but lightheaded; no gait dysfunction/falling  ; no  Tremor  ROS:no  Diaphoresis;chest pain ;    Dyspnea  Seizure activity: limb movement : not observed, [place] alone ; no   urine / stool incontinence; body injury:scalp laceration & chest contusion.   [place]. Two lacerations were sutured and he was examined. No x-rays were made.                                                                                                                                                   Review of Systems he describes residual soreness in the left frontal area, left chest and left knee. He has intermittent  left anterior chest pain without dyspnea or hemoptysis. The pain has essentially resolved except with deep breathing     Objective:   Physical Exam Gen.: Healthy and well-nourished in appearance. Alert, appropriate and cooperative throughout exam. Head: Normocephalic without obvious abnormalities Eyes: No corneal or conjunctival inflammation noted. Pupils equal round reactive to light and accommodation. FOV is normal.  Extraocular motion intact. Vision grossly normal with lenses. There is decrease superior excursion of the left eyebrow with cranial nerve testing . Ears: External  ear exam reveals no significant lesions or deformities. Canals clear .TMs normal. Hearing is grossly normal bilaterally. Nose: External nasal exam reveals no deformity or inflammation. Nasal mucosa are pink and moist. No  lesions or exudates noted. Septum  Minimal dislocation  Mouth: Oral mucosa and oropharynx reveal no lesions or exudates. Teeth in good repair. Neck: No deformities, masses, or tenderness noted. Range of motion normal Lungs: Normal respiratory effort; chest expands symmetrically. Lungs are clear to auscultation without rales, wheezes, or increased work of breathing. No significant chest wall tenderness to palpation or compression. Heart: Slow rate and regular  rhythm. Normal S1 and S2. No gallop, click, or rub. No murmur.   Marland Kitchen  Musculoskeletal/extremities: No deformity or scoliosis noted of  the thoracic or lumbar spine. No clubbing, cyanosis, edema  noted. Range of motion  normal .Tone & strength  Normal.Joints:mild DIP changes. Nail health  good. He is able to lie flat and sit up without help. There is no effusion or skin color change of the left knee; range of motion is excellent. Vascular: Carotid, radial artery, dorsalis pedis and  posterior tibial pulses are full and equal. No bruits present. Neurologic: Alert and oriented x3. Deep tendon reflexes symmetrical and normal. There is minimal asymmetry of the nasolabial folds. Gait, Romberg, and finger-nose testing is normal. There is decrease superior excursion of the left eyebrow.          Skin: Intact without suspicious lesions or rashes. He has the classic "raccoon"  Post traumatic ecchymotic pattern around his eyes. The laceration is dry without  Cellulitis. This area is sore to touch. Lymph: No cervical, axillary  lymphadenopathy present. Psych: Mood and affect are normal. Normally interactive                                                                                         Assessment & Plan:  #1 syncope in the context of bradycardia and probable postural hypotension  #2 facial trauma, status post suturing of the laceration. No evidence of active  infection. Subtle cranial nerve changes of questioned significance as noted above  #3 chest wall contusion without respiratory compromise  #4 knee contusion without posttraumatic effusion.  Plan: Baseline films to rule out orbital fracture.  Most importantly will be isometric exercises prior to standing.  A copy of this note will be sent to his cardiologist to address the bradycardia.He is on 25 mg bid of Metoprolol

## 2011-03-09 ENCOUNTER — Encounter: Payer: Self-pay | Admitting: Internal Medicine

## 2011-03-09 ENCOUNTER — Ambulatory Visit (INDEPENDENT_AMBULATORY_CARE_PROVIDER_SITE_OTHER): Payer: Federal, State, Local not specified - PPO | Admitting: Internal Medicine

## 2011-03-09 VITALS — BP 128/80 | HR 61

## 2011-03-09 DIAGNOSIS — I951 Orthostatic hypotension: Secondary | ICD-10-CM

## 2011-03-09 DIAGNOSIS — I10 Essential (primary) hypertension: Secondary | ICD-10-CM

## 2011-03-09 DIAGNOSIS — S0181XA Laceration without foreign body of other part of head, initial encounter: Secondary | ICD-10-CM

## 2011-03-09 DIAGNOSIS — R55 Syncope and collapse: Secondary | ICD-10-CM

## 2011-03-09 DIAGNOSIS — E785 Hyperlipidemia, unspecified: Secondary | ICD-10-CM

## 2011-03-09 DIAGNOSIS — S0180XA Unspecified open wound of other part of head, initial encounter: Secondary | ICD-10-CM

## 2011-03-09 NOTE — Patient Instructions (Signed)
Dip gauze in  sterile saline and applied to the wound twice a day. Cover the wound with Telfa , non stick dressing  without any antibiotic ointment. The saline can be purchased at the drugstore or you can make your own .Boil cup of salt in a gallon of water. Store mixture  in a clean container.Report Warning  signs as discussed (red streaks, pus, fever, increasing pain). Repeat the isometric exercises discussed 4- 5 times prior to standing if you've been seated for a period of time. Wear support hose

## 2011-03-09 NOTE — Progress Notes (Signed)
  Subjective:    Patient ID: Jeremy Klein, male    DOB: October 21, 1940, 70 y.o.   MRN: [id number]  HPI Jeremy Klein returns for suture removal and followup of labs and x-rays. He has had no additional postural hypotension symptoms.  In reviewing the history, the syncope occurred within seconds of standing. He did not notice any cardiac prodrome. He also mentioned that it  was extremely hot that day.  His blood pressure remains well controlled despite decreasing the metoprolol to half pill twice a day. His pulses increased from 49-61.       Review of Systems he denies fever, chills, or any purulence from the laceration site. He has some residual soreness about the left.     Objective:   Physical Exam He is in no acute distress; there is significant improvement in the ecchymosis in the maxillary area  He has no lymphadenopathy about the head, neck, axilla.  He has a  slow regular rhythm without any rate change.  Chest is clear to auscultation.  3 sutures were removed from the laceration site without difficulty. There is a tiny collection of slightly thick serous material without frank purulence. There was no evidence of cellulitis around the laceration. The eschar was removed to verify absence of active infection.         Assessment and plan: #1 syncope in the context of postural hypotension, beta blocker-induced  bradycardia, and increased environmental temperature. Isometric exercises and support hose would be recommended. There appears to be no indication for Holter monitoring  #2 facial and chest wall contusion without fracture  #3 laceration, healing without sign of active infection. Wet to dry saline dressings for several days would be recommended. He should report any fever or purulence  #4 dyslipidemia, LDL at goal at decreased Lipitor dose of 40 mg daily (previously on 80 mg daily). No change in medicines; monitor in 12 months

## 2011-04-13 ENCOUNTER — Encounter: Payer: Self-pay | Admitting: Internal Medicine

## 2011-04-16 ENCOUNTER — Encounter: Payer: Self-pay | Admitting: Internal Medicine

## 2011-04-16 ENCOUNTER — Ambulatory Visit (INDEPENDENT_AMBULATORY_CARE_PROVIDER_SITE_OTHER): Payer: Federal, State, Local not specified - PPO | Admitting: Internal Medicine

## 2011-04-16 VITALS — BP 124/80 | HR 57 | Resp 18 | Ht 72.0 in | Wt 222.4 lb

## 2011-04-16 DIAGNOSIS — I2510 Atherosclerotic heart disease of native coronary artery without angina pectoris: Secondary | ICD-10-CM

## 2011-04-16 DIAGNOSIS — E785 Hyperlipidemia, unspecified: Secondary | ICD-10-CM

## 2011-04-16 DIAGNOSIS — I059 Rheumatic mitral valve disease, unspecified: Secondary | ICD-10-CM

## 2011-04-16 DIAGNOSIS — R55 Syncope and collapse: Secondary | ICD-10-CM

## 2011-04-16 NOTE — Patient Instructions (Signed)
Your physician wants you to follow-up in:  12 months.  You will receive a reminder letter in the mail two months in advance. If you don't receive a letter, please call our office to schedule the follow-up appointment.   

## 2011-04-16 NOTE — Progress Notes (Addendum)
HPI Patient is a 70 year old with a history of mitral valve prolapse (s/p repair at [facility] in 2003), CAD (moderate of LAD), dyslipidemia. [place] last [place].  [place] and had a syncopal spell.  He was  sitting outside.  Mid AM.  It was hot  May have walked dog earlier.  Had breakfast.  Sitting for 30 min or longer.  NOt sure if took meds.    Got up  Dizzy.  Has had before.  Braced hand on table.  Next thing he found him self on all 4s on the floor.  He hit face on way down with laceration and black eyes resulting.  He was seen by Dr. Alwyn Ren.  Sutures for cut.  Hopper pulled him back on metoprolol to 12.5 mg bid. Concerning is the fact that this spell of initially started out like other dizzy spells he has had.  It was not more severe.  He says since surgery with standing he occasionally is dizzy.  Has to brace himself on a sofa chair or table for a few seconds.  Has never had a syncopal spell though.  He denies chest pains.  No SOB.  He says he is eatting healthier.  Playing golf some.  Walking. No Known Allergies  Current Outpatient Prescriptions  Medication Sig Dispense Refill  . amLODipine (NORVASC) 5 MG tablet Take 1 tablet (5 mg total) by mouth daily.  90 tablet  3  . aspirin 325 MG tablet Take 325 mg by mouth daily.        Marland Kitchen atorvastatin (LIPITOR) 40 MG tablet Take 40 mg by mouth daily.        . metoprolol tartrate (LOPRESSOR) 25 MG tablet Take 25 mg by mouth 2 (two) times daily.        . Multiple Vitamins-Minerals (ICAPS PO) Take by mouth. 2 by mouth in the am, 2 by mouth in the pm       . Saw Palmetto 500 MG CAPS Take by mouth daily.          Past Medical History  Diagnosis Date  . Macular degeneration   . Mitral valve prolapse     s/p repair 2003  . Colonic polyp   . Hyperlipidemia   . Low back pain   . Coronary artery disease     Past Surgical History  Procedure Date  . Appendectomy   . Cardiac catheterization 2003  . Colonoscopy  w/ polypectomy     Family History  Problem Relation Age of Onset  . Heart disease Father     prostate and lip cancer  . Hypertension Mother 26    died    History   Social History  . Marital Status: Married    Spouse Name: N/A    Number of Children: N/A  . Years of Education: N/A   Occupational History  . federal judge    Social History Main Topics  . Smoking status: Former Smoker    Quit date: 07/16/1978  . Smokeless tobacco: Not on file   Comment: about 25-30 years ago as of 2012   . Alcohol Use: Yes     socially  . Drug Use: No  . Sexually Active: Not on file   Other Topics Concern  . Not on file   Social History Narrative  . No narrative on file    Review of Systems:  All systems reviewed.  They are negative  to the above problem except as previously stated.  Vital Signs: BP 124/80  Pulse 53  Resp 18  Ht 6' (1.829 m)  Wt 222 lb 6.4 oz (100.88 kg)  BMI 30.16 kg/m2  Physical Exam  Patient is in NAD HEENT:  Normocephalic, atraumatic. EOMI, PERRLA.  Neck: JVP is normal. No thyromegaly. No bruits.  Lungs: clear to auscultation. No rales no wheezes.  Heart: Regular rate and rhythm. Normal S1, S2. No S3.   No significant murmurs. PMI not displaced.  Abdomen:  Supple, nontender. Normal bowel sounds. No masses. No hepatomegaly.  Extremities:   Good distal pulses throughout. No lower extremity edema.  Musculoskeletal :moving all extremities.  Neuro:   alert and oriented x3.  CN II-XII grossly intact.  EKG:  Sinus bradycardia.  53 bpm.   Assessment and Plan:

## 2011-04-17 DIAGNOSIS — R55 Syncope and collapse: Secondary | ICD-10-CM | POA: Insufficient documentation

## 2011-04-17 NOTE — Assessment & Plan Note (Signed)
LDL was 80 on recent check.  Discussed diet.  Not changes.

## 2011-04-17 NOTE — Assessment & Plan Note (Signed)
Excellent repair.  No murmur.

## 2011-04-17 NOTE — Assessment & Plan Note (Signed)
No symptoms to suggest angina. 

## 2011-04-17 NOTE — Assessment & Plan Note (Signed)
This is his first syncopal spell.  Orthostatic check today he is not orthostatic.  Pulse actually decreases with standing.  BP is stable. I have reviewed with Odessa Fleming.  Not unreasonable to stop b blocker all together.  I would taper over 1 wk.   May have been related to heat, sitting.  I have discussed with him measures to avoid (keep legs active) If he has another spell would recommend a tilt table test.

## 2011-05-16 ENCOUNTER — Encounter: Payer: Federal, State, Local not specified - PPO | Admitting: Internal Medicine

## 2011-07-23 ENCOUNTER — Telehealth: Payer: Self-pay | Admitting: *Deleted

## 2011-07-23 DIAGNOSIS — E782 Mixed hyperlipidemia: Secondary | ICD-10-CM

## 2011-07-23 MED ORDER — ATORVASTATIN CALCIUM 40 MG PO TABS: 40.0000 mg | ORAL_TABLET | Freq: Every day | ORAL | Status: DC

## 2011-07-23 NOTE — Telephone Encounter (Signed)
Patient called and needed a refill on Lipitor. Also set up for fasting lab work on 2/8 per patient's request.

## 2011-08-24 ENCOUNTER — Other Ambulatory Visit (INDEPENDENT_AMBULATORY_CARE_PROVIDER_SITE_OTHER): Payer: Federal, State, Local not specified - PPO | Admitting: *Deleted

## 2011-08-24 DIAGNOSIS — E782 Mixed hyperlipidemia: Secondary | ICD-10-CM | POA: Diagnosis not present

## 2011-08-24 LAB — LIPID PANEL
Cholesterol: 154 mg/dL (ref 0–200)
HDL: 56.2 mg/dL (ref >39.00)
LDL Cholesterol: 77 mg/dL (ref 0–99)
Total CHOL/HDL Ratio: 3
Triglycerides: 104 mg/dL (ref 0.0–149.0)
VLDL: 20.8 mg/dL (ref 0.0–40.0)

## 2011-08-24 LAB — AST: AST: 17 U/L (ref 0–37)

## 2011-10-18 DIAGNOSIS — H2510 Age-related nuclear cataract, unspecified eye: Secondary | ICD-10-CM | POA: Diagnosis not present

## 2011-10-18 DIAGNOSIS — H353190 Nonexudative age-related macular degeneration, unspecified eye, stage unspecified: Secondary | ICD-10-CM | POA: Diagnosis not present

## 2011-11-05 ENCOUNTER — Telehealth: Payer: Self-pay | Admitting: *Deleted

## 2011-11-05 MED ORDER — AMLODIPINE BESYLATE 5 MG PO TABS: 5.0000 mg | ORAL_TABLET | Freq: Every day | ORAL | Status: DC

## 2011-11-05 NOTE — Telephone Encounter (Signed)
Patient had left a message asking for refills to [facility]. Clarified with his wife that he needed refills for Norvasc. She asked for a recommendation for seasonal allergies and I advised Chlortrimeton prn.

## 2011-11-15 DIAGNOSIS — L570 Actinic keratosis: Secondary | ICD-10-CM | POA: Diagnosis not present

## 2011-11-15 DIAGNOSIS — L259 Unspecified contact dermatitis, unspecified cause: Secondary | ICD-10-CM | POA: Diagnosis not present

## 2012-01-16 ENCOUNTER — Ambulatory Visit: Payer: Medicare Other | Admitting: Internal Medicine

## 2012-01-21 ENCOUNTER — Encounter: Payer: Self-pay | Admitting: Internal Medicine

## 2012-01-21 ENCOUNTER — Ambulatory Visit (INDEPENDENT_AMBULATORY_CARE_PROVIDER_SITE_OTHER): Payer: Medicare Other | Admitting: Internal Medicine

## 2012-01-21 VITALS — BP 130/82 | HR 62 | Temp 97.6°F | Wt 221.2 lb

## 2012-01-21 DIAGNOSIS — H9209 Otalgia, unspecified ear: Secondary | ICD-10-CM | POA: Diagnosis not present

## 2012-01-21 NOTE — Progress Notes (Signed)
  Subjective:    Patient ID: Jeremy Klein, male    DOB: [birth year], 71 y.o.   MRN: [id number]  HPI  He describes intermittent dull left mastoid area discomfort over the past 2-3 weeks but none since the middle of last week. There was no trigger or predisposition to this such as an upper respiratory tract infection. There was no associated hearing loss, discharge or tinnitus. He had no fever, chills, or sweats. He did not treat the symptoms which lasted up to minutes. There were no exacerbating factors such as temperature change or wind exposure.    Review of Systems Specifically denies frontal headache, facial pain, nasal purulence, sore throat, cough, or sputum production. He has had no associated balance or vertigo issues.     Objective:   Physical Exam General appearance:good health ;well nourished; no acute distress or increased work of breathing is present.  No  lymphadenopathy about the head, neck, or axilla noted.   Eyes: No conjunctival inflammation or lid edema is present. There is no scleral icterus. Extraocular motion is intact. No nystagmus is present. Vision is normal to confrontation with lenses. FOV normal  Ears:  External ear exam shows no significant lesions or deformities.  Otoscopic examination reveals clear canals, tympanic membranes are intact bilaterally without bulging, retraction, inflammation or discharge. Tuning fork exam is normal. Whisper is heard at 6 feet bilaterally. There is no tenderness to palpation of the left mastoid  Nose:  External nasal examination shows no deformity or inflammation. Nasal mucosa are pink and moist without lesions or exudates. No septal dislocation or deviation.No obstruction to airflow.   Oral exam: Dental hygiene is good; lips and gums are healthy appearing.There is no oropharyngeal erythema or exudate noted. There is mild dislocation of the TMJ on the left; there's no associated tenderness  Neck:  No deformities, thyromegaly, masses,  or tenderness noted.   Supple with full range of motion without pain.   Neuro: Rhomberg and finger-nose testing is normal. Gait is normal   Skin: Warm & dry w/o jaundice         Assessment & Plan:  #1 left mastoid discomfort without associated fever or signs of upper respiratory tract infection. This is most likely related to some edema in the mastoid without frank infection. There are no neurologic findings at this time

## 2012-01-21 NOTE — Patient Instructions (Addendum)

## 2012-02-25 ENCOUNTER — Other Ambulatory Visit: Payer: Self-pay | Admitting: *Deleted

## 2012-02-25 MED ORDER — ATORVASTATIN CALCIUM 40 MG PO TABS: 40.0000 mg | ORAL_TABLET | Freq: Every day | ORAL | Status: DC

## 2012-03-18 ENCOUNTER — Ambulatory Visit (INDEPENDENT_AMBULATORY_CARE_PROVIDER_SITE_OTHER): Payer: Medicare Other | Admitting: Internal Medicine

## 2012-03-18 ENCOUNTER — Encounter: Payer: Self-pay | Admitting: Internal Medicine

## 2012-03-18 VITALS — BP 130/82 | HR 59 | Temp 97.7°F | Resp 12 | Ht 72.08 in | Wt 220.8 lb

## 2012-03-18 DIAGNOSIS — E785 Hyperlipidemia, unspecified: Secondary | ICD-10-CM | POA: Diagnosis not present

## 2012-03-18 DIAGNOSIS — D126 Benign neoplasm of colon, unspecified: Secondary | ICD-10-CM

## 2012-03-18 DIAGNOSIS — N429 Disorder of prostate, unspecified: Secondary | ICD-10-CM | POA: Diagnosis not present

## 2012-03-18 DIAGNOSIS — I10 Essential (primary) hypertension: Secondary | ICD-10-CM | POA: Diagnosis not present

## 2012-03-18 DIAGNOSIS — Z0000 Encounter for general adult medical examination without abnormal findings: Secondary | ICD-10-CM

## 2012-03-18 LAB — CBC WITH DIFFERENTIAL/PLATELET
Basophils Absolute: 0 10*3/uL (ref 0.0–0.1)
Basophils Relative: 0.2 % (ref 0.0–3.0)
Eosinophils Absolute: 0.1 10*3/uL (ref 0.0–0.7)
Eosinophils Relative: 1.6 % (ref 0.0–5.0)
HCT: 47.3 % (ref 39.0–52.0)
Hemoglobin: 15.5 g/dL (ref 13.0–17.0)
Lymphocytes Relative: 26.7 % (ref 12.0–46.0)
Lymphs Abs: 1.3 10*3/uL (ref 0.7–4.0)
MCHC: 32.8 g/dL (ref 30.0–36.0)
MCV: 91.5 fl (ref 78.0–100.0)
Monocytes Absolute: 0.4 10*3/uL (ref 0.1–1.0)
Monocytes Relative: 9 % (ref 3.0–12.0)
Neutro Abs: 3.1 10*3/uL (ref 1.4–7.7)
Neutrophils Relative %: 62.5 % (ref 43.0–77.0)
Platelets: 197 10*3/uL (ref 150.0–400.0)
RBC: 5.16 Mil/uL (ref 4.22–5.81)
RDW: 14.1 % (ref 11.5–14.6)
WBC: 4.9 10*3/uL (ref 4.5–10.5)

## 2012-03-18 LAB — HEPATIC FUNCTION PANEL
ALT: 18 U/L (ref 0–53)
AST: 20 U/L (ref 0–37)
Albumin: 4.2 g/dL (ref 3.5–5.2)
Alkaline Phosphatase: 65 U/L (ref 39–117)
Bilirubin, Direct: 0.2 mg/dL (ref 0.0–0.3)
Total Bilirubin: 0.9 mg/dL (ref 0.3–1.2)
Total Protein: 6.5 g/dL (ref 6.0–8.3)

## 2012-03-18 LAB — PSA: PSA: 1.92 ng/mL (ref 0.10–4.00)

## 2012-03-18 LAB — TSH: TSH: 1.42 u[IU]/mL (ref 0.35–5.50)

## 2012-03-18 LAB — LIPID PANEL
Cholesterol: 164 mg/dL (ref 0–200)
HDL: 56 mg/dL (ref >39.00)
LDL Cholesterol: 81 mg/dL (ref 0–99)
Total CHOL/HDL Ratio: 3
Triglycerides: 133 mg/dL (ref 0.0–149.0)
VLDL: 26.6 mg/dL (ref 0.0–40.0)

## 2012-03-18 LAB — BASIC METABOLIC PANEL
BUN: 11 mg/dL (ref 6–23)
CO2: 29 mEq/L (ref 19–32)
Calcium: 9.3 mg/dL (ref 8.4–10.5)
Chloride: 101 mEq/L (ref 96–112)
Creatinine, Ser: 0.7 mg/dL (ref 0.4–1.5)
GFR: 116.26 mL/min (ref >60.00)
Glucose, Bld: 84 mg/dL (ref 70–99)
Potassium: 4.1 mEq/L (ref 3.5–5.1)
Sodium: 138 mEq/L (ref 135–145)

## 2012-03-18 NOTE — Patient Instructions (Addendum)
Preventive Health Care: Exercise at least 30-45 minutes a day,  3-4 days a week.  Eat a low-fat diet with lots of fruits and vegetables, up to 7-9 servings per day. Consume less than 40 grams of sugar per day from foods & drinks with High Fructose Corn Sugar as #1,2,3 or # 4 on label. Blood Pressure Goal  Ideally is an AVERAGE < 135/85. This AVERAGE should be calculated from @ least 5-7 BP readings taken @ different times of day on different days of week. You should not respond to isolated BP readings , but rather the AVERAGE for that week.  If you activate My Chart; the results can be released to you as soon as they populate from the lab. If you choose not to use this program; the labs have to be reviewed, copied & mailed   causing a delay in getting the results to you.  

## 2012-03-18 NOTE — Progress Notes (Signed)
Subjective:    Patient ID: Jeremy Klein, male    DOB: 12/07/1940, 71 y.o.   MRN: [id number]  HPI  Jeremy Klein  is here for a Medicare physical; he denies acute issues . Medicare Wellness Visit:  The following psychosocial & medical history were reviewed as required by Medicare.   Social history: caffeine: 1 cup , alcohol:  3/ week ,  tobacco use : quit in 1980  & exercise : walking > 3 days /week.   Home & personal  safety / fall risk: no issues, activities of daily living: no limitations , seatbelt use : yes , and smoke alarm employment : yes .  Power of Attorney/Living Will status : in place  Vision ( as recorded per Nurse) & Hearing  evaluation : [facility] Opth F/U up to date.No hearing evaluation Orientation :oriented X 3 , memory & recall :good,  math testing: good,and mood & affect : normal . Depression / anxiety: denied Travel history : last 2011 [place], immunization status :up to date , transfusion history:  no, and preventive health surveillance ( colonoscopies, BMD , etc as per protocol/ [facility]): colonoscopy up to date, Dental care:  Seen every 6 mos . Chart reviewed &  Updated. Active issues reviewed & addressed.         Review of Systems HYPERTENSION: Disease Monitoring: Blood pressure range-not monitored  Chest pain, palpitations- no       Dyspnea- no Medications: Compliance- yes  Lightheadedness,Syncope- no    Edema- no  HYPERLIPIDEMIA: Disease Monitoring: See symptoms for Hypertension Medications: Compliance- yes  Abd pain, bowel changes- no  Muscle aches- no  His Lipitor was decreased from 80 mg to 40 mg. On the lower dose his LDL was 77; HDL 56.2;& triglycerides 104 on 08/24/11. Catheterization in 2004 had not revealed significant coronary disease.            Objective:   Physical Exam Gen.: Healthy and well-nourished in appearance. Alert, appropriate and cooperative throughout exam. Head: Normocephalic without obvious abnormalities;  no alopecia  Eyes: No  corneal or conjunctival inflammation noted. Pupils equal round reactive to light and accommodation. Fundal exam is benign without hemorrhages, exudate, papilledema. Extraocular motion intact. Vision grossly normal with lenses. Ears: External  ear exam reveals no significant lesions or deformities. Canals clear .TMs normal. Hearing is grossly normal bilaterally. Nose: External nasal exam reveals no deformity or inflammation. Nasal mucosa are pink and moist. No lesions or exudates noted.   Mouth: Oral mucosa and oropharynx reveal no lesions or exudates. Teeth in good repair. Neck: No deformities, masses, or tenderness noted. Range of motion & Thyroid  normal. Lungs: Normal respiratory effort; chest expands symmetrically. Lungs are clear to auscultation without rales, wheezes, or increased work of breathing. Heart: Normal rate and rhythm. Normal S1 and S2. No gallop, click, or rub. S4 w/o murmur. Abdomen: Bowel sounds normal; abdomen soft and nontender. No masses, organomegaly or hernias noted. Genitalia/DRE: Epididymal scarring bilaterally; otherwise genital exam unremarkable. Prostate is upper limits of normal without nodularity, asymmetry, or induration. Musculoskeletal/extremities: No deformity or scoliosis noted of  the thoracic or lumbar spine. No clubbing, cyanosis, edema, or deformity noted. Range of motion  normal .Tone & strength  normal. Nail health  good. He is able to lie flat and sit up without help. Straight leg raising is negative bilaterally. He has mild flexion contractures of the fifth digit, right greater than left. Posttraumatic changes are noted of the fourth left finger. Vascular: Carotid, radial artery,  dorsalis pedis and  posterior tibial pulses are full and equal. No bruits present. Neurologic: Alert and oriented x3. Deep tendon reflexes symmetrical but 1/2+ @ knees          Skin: Intact without suspicious lesions or rashes. Lymph: No cervical, axillary, or inguinal  lymphadenopathy present. Psych: Mood and affect are normal. Normally interactive                                                                                         Assessment & Plan:  #1 Medicare Wellness Exam; criteria met ; data entered #2 Problem List reviewed ; Assessment/ Recommendations made Plan: see Orders

## 2012-03-31 DIAGNOSIS — L03039 Cellulitis of unspecified toe: Secondary | ICD-10-CM | POA: Diagnosis not present

## 2012-03-31 DIAGNOSIS — S90129A Contusion of unspecified lesser toe(s) without damage to nail, initial encounter: Secondary | ICD-10-CM | POA: Diagnosis not present

## 2012-04-03 ENCOUNTER — Encounter: Payer: Self-pay | Admitting: Gastroenterology

## 2012-04-10 ENCOUNTER — Encounter: Payer: Self-pay | Admitting: Internal Medicine

## 2012-04-10 ENCOUNTER — Encounter: Payer: Self-pay | Admitting: Gastroenterology

## 2012-04-21 DIAGNOSIS — S90129A Contusion of unspecified lesser toe(s) without damage to nail, initial encounter: Secondary | ICD-10-CM | POA: Diagnosis not present

## 2012-04-21 DIAGNOSIS — L03039 Cellulitis of unspecified toe: Secondary | ICD-10-CM | POA: Diagnosis not present

## 2012-05-27 ENCOUNTER — Other Ambulatory Visit: Payer: Self-pay | Admitting: Dermatology

## 2012-05-27 DIAGNOSIS — D239 Other benign neoplasm of skin, unspecified: Secondary | ICD-10-CM | POA: Diagnosis not present

## 2012-05-27 DIAGNOSIS — D1801 Hemangioma of skin and subcutaneous tissue: Secondary | ICD-10-CM | POA: Diagnosis not present

## 2012-05-27 DIAGNOSIS — D485 Neoplasm of uncertain behavior of skin: Secondary | ICD-10-CM | POA: Diagnosis not present

## 2012-05-27 DIAGNOSIS — D2360 Other benign neoplasm of skin of unspecified upper limb, including shoulder: Secondary | ICD-10-CM | POA: Diagnosis not present

## 2012-05-27 DIAGNOSIS — L570 Actinic keratosis: Secondary | ICD-10-CM | POA: Diagnosis not present

## 2012-08-18 ENCOUNTER — Other Ambulatory Visit: Payer: Self-pay

## 2012-08-18 MED ORDER — ATORVASTATIN CALCIUM 40 MG PO TABS: 40.0000 mg | ORAL_TABLET | Freq: Every day | ORAL | Status: DC

## 2012-08-30 ENCOUNTER — Other Ambulatory Visit: Payer: Self-pay

## 2012-09-03 ENCOUNTER — Encounter: Payer: Self-pay | Admitting: Gastroenterology

## 2012-09-13 HISTORY — PX: COLONOSCOPY W/ POLYPECTOMY: SHX1380

## 2012-09-15 ENCOUNTER — Ambulatory Visit (AMBULATORY_SURGERY_CENTER): Payer: Federal, State, Local not specified - PPO | Admitting: *Deleted

## 2012-09-15 VITALS — Ht 72.0 in | Wt 217.0 lb

## 2012-09-15 DIAGNOSIS — Z1211 Encounter for screening for malignant neoplasm of colon: Secondary | ICD-10-CM

## 2012-09-15 MED ORDER — MOVIPREP 100 G PO SOLR: ORAL | Status: DC

## 2012-10-01 ENCOUNTER — Encounter: Payer: Self-pay | Admitting: Gastroenterology

## 2012-10-01 ENCOUNTER — Ambulatory Visit (AMBULATORY_SURGERY_CENTER): Payer: Medicare Other | Admitting: Gastroenterology

## 2012-10-01 VITALS — BP 143/82 | HR 60 | Temp 96.6°F | Resp 26 | Ht 72.0 in | Wt 217.0 lb

## 2012-10-01 DIAGNOSIS — D126 Benign neoplasm of colon, unspecified: Secondary | ICD-10-CM

## 2012-10-01 DIAGNOSIS — Z1211 Encounter for screening for malignant neoplasm of colon: Secondary | ICD-10-CM

## 2012-10-01 DIAGNOSIS — I2510 Atherosclerotic heart disease of native coronary artery without angina pectoris: Secondary | ICD-10-CM | POA: Diagnosis not present

## 2012-10-01 DIAGNOSIS — M545 Low back pain, unspecified: Secondary | ICD-10-CM | POA: Diagnosis not present

## 2012-10-01 DIAGNOSIS — I10 Essential (primary) hypertension: Secondary | ICD-10-CM | POA: Diagnosis not present

## 2012-10-01 DIAGNOSIS — E785 Hyperlipidemia, unspecified: Secondary | ICD-10-CM | POA: Diagnosis not present

## 2012-10-01 MED ORDER — SODIUM CHLORIDE 0.9 % IV SOLN: 500.0000 mL | INTRAVENOUS | Status: DC

## 2012-10-01 NOTE — Patient Instructions (Addendum)
Discharge instructions given with verbal understanding. Handout on polyps given. Resume previous medications. YOU HAD AN ENDOSCOPIC PROCEDURE TODAY AT THE [facility]: Refer to the procedure report that was given to you for any specific questions about what was found during the examination.  If the procedure report does not answer your questions, please call your gastroenterologist to clarify.  If you requested that your care partner not be given the details of your procedure findings, then the procedure report has been included in a sealed envelope for you to review at your convenience later.  YOU SHOULD EXPECT: Some feelings of bloating in the abdomen. Passage of more gas than usual.  Walking can help get rid of the air that was put into your GI tract during the procedure and reduce the bloating. If you had a lower endoscopy (such as a colonoscopy or flexible sigmoidoscopy) you may notice spotting of blood in your stool or on the toilet paper. If you underwent a bowel prep for your procedure, then you may not have a normal bowel movement for a few days.  DIET: Your first meal following the procedure should be a light meal and then it is ok to progress to your normal diet.  A half-sandwich or bowl of soup is an example of a good first meal.  Heavy or fried foods are harder to digest and may make you feel nauseous or bloated.  Likewise meals heavy in dairy and vegetables can cause extra gas to form and this can also increase the bloating.  Drink plenty of fluids but you should avoid alcoholic beverages for 24 hours.  ACTIVITY: Your care partner should take you home directly after the procedure.  You should plan to take it easy, moving slowly for the rest of the day.  You can resume normal activity the day after the procedure however you should NOT DRIVE or use heavy machinery for 24 hours (because of the sedation medicines used during the test).    SYMPTOMS TO REPORT IMMEDIATELY: A  gastroenterologist can be reached at any hour.  During normal business hours, 8:30 AM to 5:00 PM Monday through Friday, call [phone].  After hours and on weekends, please call the GI answering service at [phone] who will take a message and have the physician on call contact you.   Following lower endoscopy (colonoscopy or flexible sigmoidoscopy):  Excessive amounts of blood in the stool  Significant tenderness or worsening of abdominal pains  Swelling of the abdomen that is new, acute  Fever of 100F or higher  FOLLOW UP: If any biopsies were taken you will be contacted by phone or by letter within the next 1-3 weeks.  Call your gastroenterologist if you have not heard about the biopsies in 3 weeks.  Our staff will call the home number listed on your records the next business day following your procedure to check on you and address any questions or concerns that you may have at that time regarding the information given to you following your procedure. This is a courtesy call and so if there is no answer at the home number and we have not heard from you through the emergency physician on call, we will assume that you have returned to your regular daily activities without incident.  SIGNATURES/CONFIDENTIALITY: You and/or your care partner have signed paperwork which will be entered into your electronic medical record.  These signatures attest to the fact that that the information above on your After Visit Summary has   been reviewed and is understood.  Full responsibility of the confidentiality of this discharge information lies with you and/or your care-partner. 

## 2012-10-01 NOTE — Progress Notes (Signed)
Report to pacu rn, vss, bbs=clear 

## 2012-10-01 NOTE — Progress Notes (Signed)
Called to room to assist during endoscopic procedure.  Patient ID and intended procedure confirmed with present staff. Received instructions for my participation in the procedure from the performing physician.  

## 2012-10-01 NOTE — Progress Notes (Signed)
Patient did not experience any of the following events: a burn prior to discharge; a fall within the facility; wrong site/side/patient/procedure/implant event; or a hospital transfer or hospital admission upon discharge from the facility. (G8907) Patient did not have preoperative order for IV antibiotic SSI prophylaxis. (G8918)  

## 2012-10-01 NOTE — Op Note (Signed)
[facility] [address] [place], [address]   COLONOSCOPY PROCEDURE REPORT  PATIENT: Jeremy Klein, Jeremy Klein  MR#: [id number] BIRTHDATE: 1940-08-27 , 71  yrs. old GENDER: Male ENDOSCOPIST: Mardella Layman, MD, [facility] REFERRED BY:  Marga Melnick, M.D. PROCEDURE DATE:  10/01/2012 PROCEDURE:   Colonoscopy with snare polypectomy ASA CLASS:   Class III INDICATIONS:Patient's personal history of adenomatous colon polyps and Colorectal cancer screening. MEDICATIONS: Propofol (Diprivan) 150 mg IV  DESCRIPTION OF PROCEDURE:   After the risks and benefits and of the procedure were explained, informed consent was obtained.  A digital rectal exam revealed no abnormalities of the rectum.    The LB [id number]-[id number]  endoscope was introduced through the anus and advanced to the cecum, which was identified by both the appendix and ileocecal valve .  The quality of the prep was excellent, using MoviPrep .  The instrument was then slowly withdrawn as the colon was fully examined.     COLON FINDINGS: Two small smooth flat polyps were found in the sigmoid colon.  A polypectomy was performed with a cold snare.  The resection was complete and the polyp tissue was completely retrieved.   The colon was otherwise normal.  There was no diverticulosis, inflammation, polyps or cancers unless previously stated.     Retroflexed views revealed no abnormalities.     The scope was then withdrawn from the patient and the procedure completed.  COMPLICATIONS: There were no complications. ENDOSCOPIC IMPRESSION: 1.   Two small flat polyps were found in the sigmoid colon; polypectomy was performed with a cold snare 2.   The colon was otherwise normal  RECOMMENDATIONS: 1.  Continue current medications 2.  Repeat Colonoscopy in 5 years.   REPEAT EXAM:  cc:  _______________________________ eSignedMardella Layman, MD, [facility] 10/01/2012 9:29 AM

## 2012-10-02 ENCOUNTER — Telehealth: Payer: Self-pay | Admitting: *Deleted

## 2012-10-02 NOTE — Telephone Encounter (Signed)
  Follow up Call-  Call back number 10/01/2012  Post procedure Call Back phone  # [phone]  Permission to leave phone message Yes     Patient questions:  Do you have a fever, pain , or abdominal swelling? no Pain Score  0 *  Have you tolerated food without any problems? yes  Have you been able to return to your normal activities? yes  Do you have any questions about your discharge instructions: Diet   no Medications  no Follow up visit  no  Do you have questions or concerns about your Care? no  Actions: * If pain score is 4 or above: No action needed, pain <4.

## 2012-10-14 ENCOUNTER — Encounter: Payer: Self-pay | Admitting: Gastroenterology

## 2012-10-23 DIAGNOSIS — H353190 Nonexudative age-related macular degeneration, unspecified eye, stage unspecified: Secondary | ICD-10-CM | POA: Diagnosis not present

## 2012-10-23 DIAGNOSIS — H2510 Age-related nuclear cataract, unspecified eye: Secondary | ICD-10-CM | POA: Diagnosis not present

## 2012-10-27 ENCOUNTER — Other Ambulatory Visit: Payer: Self-pay | Admitting: *Deleted

## 2012-10-27 MED ORDER — ATORVASTATIN CALCIUM 40 MG PO TABS: 40.0000 mg | ORAL_TABLET | Freq: Every day | ORAL | Status: DC

## 2012-10-27 MED ORDER — AMLODIPINE BESYLATE 5 MG PO TABS: 5.0000 mg | ORAL_TABLET | Freq: Every day | ORAL | Status: DC

## 2012-11-03 ENCOUNTER — Encounter: Payer: Self-pay | Admitting: Internal Medicine

## 2012-11-03 ENCOUNTER — Encounter: Payer: Self-pay | Admitting: *Deleted

## 2012-11-03 ENCOUNTER — Ambulatory Visit (INDEPENDENT_AMBULATORY_CARE_PROVIDER_SITE_OTHER): Payer: Medicare Other | Admitting: Internal Medicine

## 2012-11-03 VITALS — BP 138/88 | HR 61 | Ht 72.0 in | Wt 218.0 lb

## 2012-11-03 DIAGNOSIS — I2510 Atherosclerotic heart disease of native coronary artery without angina pectoris: Secondary | ICD-10-CM

## 2012-11-03 DIAGNOSIS — I25810 Atherosclerosis of coronary artery bypass graft(s) without angina pectoris: Secondary | ICD-10-CM

## 2012-11-03 DIAGNOSIS — E785 Hyperlipidemia, unspecified: Secondary | ICD-10-CM | POA: Diagnosis not present

## 2012-11-03 NOTE — Progress Notes (Signed)
HPI Patient is a 72 year old with a history of mitral valve prolapse (s/p repair at [facility] in 2003), CAD (moderate of LAD), dyslipidemia  He was last in cardiology clinic in Fall 2012  Last lipid panel in Fall 2013  LDL was 81, HDL was 56.   Since seen he has done well  No CP  No SOB  Has lost a few pounds  [place] afew days per week. No Known Allergies  Current Outpatient Prescriptions  Medication Sig Dispense Refill  . amLODipine (NORVASC) 5 MG tablet Take 1 tablet (5 mg total) by mouth daily.  90 tablet  3  . aspirin 325 MG tablet Take 325 mg by mouth daily.        Marland Kitchen atorvastatin (LIPITOR) 40 MG tablet Take 1 tablet (40 mg total) by mouth daily.  90 tablet  3  . Multiple Vitamins-Minerals (ICAPS PO) Take by mouth. 2 by mouth in the am, 2 by mouth in the pm       . Saw Palmetto 500 MG CAPS Take by mouth daily.         No current facility-administered medications for this visit.    Past Medical History  Diagnosis Date  . Macular degeneration     Dr Gwendalyn Ege, [facility]  . Mitral valve prolapse     S/P  repair 2003  . Colonic polyp     Dr Jarold Motto  . Hyperlipidemia   . Low back pain     intermittent  . Coronary artery disease     Dr Tenny Craw; not significant    Past Surgical History  Procedure Laterality Date  . Appendectomy    . Cardiac catheterization  2003  . Colonoscopy w/ polypectomy       Dr Julien Girt [facility]  . Mv repair  2003    Dr Janey Genta, [facility]    Family History  Problem Relation Age of Onset  . Heart attack Father 59  . Prostate cancer Father   . Skin cancer Father     Lip  . Hypertension Mother 86  . Diabetes Neg Hx   . Stroke Neg Hx   . Colon cancer Neg Hx     History   Social History  . Marital Status: Married    Spouse Name: N/A    Number of Children: N/A  . Years of Education: N/A   Occupational History  . federal judge    Social History Main Topics  . Smoking status: Former Smoker    Quit date: 07/16/1978  . Smokeless tobacco: Never Used   Comment: about 25-30 years ago as of 2012   . Alcohol Use: 1.8 oz/week    3 Shots of liquor per week     Comment: socially  . Drug Use: No  . Sexually Active: Not on file   Other Topics Concern  . Not on file   Social History Narrative  . No narrative on file    Review of Systems:  All systems reviewed.  They are negative to the above problem except as previously stated.  Vital Signs: BP 138/88  Pulse 61  Ht 6' (1.829 m)  Wt 218 lb (98.884 kg)  BMI 29.56 kg/m2  SpO2 95%  Physical Exam Patient is in NAD HEENT:  Normocephalic, atraumatic. EOMI, PERRLA.  Neck: JVP is normal.  No bruits.  Lungs: clear to auscultation. No rales no wheezes.  Heart: Regular rate and rhythm. Normal S1, S2. No S3.   No significant murmurs. PMI not displaced.  Abdomen:  Supple, nontender. Normal bowel sounds. No masses. No hepatomegaly.  Extremities:   Good distal pulses throughout. No lower extremity edema.  Musculoskeletal :moving all extremities.  Neuro:   alert and oriented x3.  CN II-XII grossly intact.  EKG  SR 61 Assessment and Plan:  1.  CAD   Remains asymptomatic  2.  HTN  BP is high on my check at 150/90  I will check at home  I encouraged him to get a cuff  3.  HL  Good control at last check in Fall 2013  4.  S/p MV repair  Execellent result.

## 2012-11-03 NOTE — Patient Instructions (Addendum)
Your physician wants you to follow-up in: 12 months with Dr Filbert Schilder will receive a reminder letter in the mail two months in advance. If you don't receive a letter, please call our office to schedule the follow-up appointment.  Your physician recommends that you continue on your current medications as directed. Please refer to the Current Medication list given to you today.

## 2012-12-18 DIAGNOSIS — H01009 Unspecified blepharitis unspecified eye, unspecified eyelid: Secondary | ICD-10-CM | POA: Diagnosis not present

## 2012-12-18 DIAGNOSIS — H353190 Nonexudative age-related macular degeneration, unspecified eye, stage unspecified: Secondary | ICD-10-CM | POA: Diagnosis not present

## 2012-12-18 DIAGNOSIS — H2510 Age-related nuclear cataract, unspecified eye: Secondary | ICD-10-CM | POA: Diagnosis not present

## 2012-12-18 DIAGNOSIS — H04129 Dry eye syndrome of unspecified lacrimal gland: Secondary | ICD-10-CM | POA: Diagnosis not present

## 2013-02-18 ENCOUNTER — Other Ambulatory Visit: Payer: Self-pay

## 2013-04-22 DIAGNOSIS — Z23 Encounter for immunization: Secondary | ICD-10-CM | POA: Diagnosis not present

## 2013-05-21 ENCOUNTER — Other Ambulatory Visit: Payer: Self-pay

## 2013-09-09 ENCOUNTER — Encounter: Payer: Self-pay | Admitting: Internal Medicine

## 2013-09-09 ENCOUNTER — Other Ambulatory Visit (INDEPENDENT_AMBULATORY_CARE_PROVIDER_SITE_OTHER): Payer: Medicare Other

## 2013-09-09 ENCOUNTER — Ambulatory Visit (INDEPENDENT_AMBULATORY_CARE_PROVIDER_SITE_OTHER): Payer: Medicare Other | Admitting: Internal Medicine

## 2013-09-09 VITALS — BP 140/70 | HR 66 | Temp 97.5°F | Resp 12 | Ht 72.0 in | Wt 217.0 lb

## 2013-09-09 DIAGNOSIS — E785 Hyperlipidemia, unspecified: Secondary | ICD-10-CM

## 2013-09-09 DIAGNOSIS — Z23 Encounter for immunization: Secondary | ICD-10-CM

## 2013-09-09 DIAGNOSIS — Z0000 Encounter for general adult medical examination without abnormal findings: Secondary | ICD-10-CM | POA: Diagnosis not present

## 2013-09-09 DIAGNOSIS — N429 Disorder of prostate, unspecified: Secondary | ICD-10-CM

## 2013-09-09 DIAGNOSIS — I10 Essential (primary) hypertension: Secondary | ICD-10-CM | POA: Diagnosis not present

## 2013-09-09 DIAGNOSIS — I2510 Atherosclerotic heart disease of native coronary artery without angina pectoris: Secondary | ICD-10-CM

## 2013-09-09 DIAGNOSIS — D126 Benign neoplasm of colon, unspecified: Secondary | ICD-10-CM | POA: Diagnosis not present

## 2013-09-09 LAB — CBC WITH DIFFERENTIAL/PLATELET
Basophils Absolute: 0 10*3/uL (ref 0.0–0.1)
Basophils Relative: 0.4 % (ref 0.0–3.0)
Eosinophils Absolute: 0.1 10*3/uL (ref 0.0–0.7)
Eosinophils Relative: 1.3 % (ref 0.0–5.0)
HCT: 48.6 % (ref 39.0–52.0)
Hemoglobin: 15.9 g/dL (ref 13.0–17.0)
Lymphocytes Relative: 23.8 % (ref 12.0–46.0)
Lymphs Abs: 1.4 10*3/uL (ref 0.7–4.0)
MCHC: 32.7 g/dL (ref 30.0–36.0)
MCV: 91.9 fl (ref 78.0–100.0)
Monocytes Absolute: 0.5 10*3/uL (ref 0.1–1.0)
Monocytes Relative: 8.5 % (ref 3.0–12.0)
Neutro Abs: 3.8 10*3/uL (ref 1.4–7.7)
Neutrophils Relative %: 66 % (ref 43.0–77.0)
Platelets: 201 10*3/uL (ref 150.0–400.0)
RBC: 5.29 Mil/uL (ref 4.22–5.81)
RDW: 14.2 % (ref 11.5–14.6)
WBC: 5.7 10*3/uL (ref 4.5–10.5)

## 2013-09-09 LAB — HEPATIC FUNCTION PANEL
ALT: 18 U/L (ref 0–53)
AST: 17 U/L (ref 0–37)
Albumin: 4.1 g/dL (ref 3.5–5.2)
Alkaline Phosphatase: 64 U/L (ref 39–117)
Bilirubin, Direct: 0.1 mg/dL (ref 0.0–0.3)
Total Bilirubin: 0.9 mg/dL (ref 0.3–1.2)
Total Protein: 6.5 g/dL (ref 6.0–8.3)

## 2013-09-09 LAB — BASIC METABOLIC PANEL
BUN: 14 mg/dL (ref 6–23)
CO2: 31 mEq/L (ref 19–32)
Calcium: 9.4 mg/dL (ref 8.4–10.5)
Chloride: 101 mEq/L (ref 96–112)
Creatinine, Ser: 0.7 mg/dL (ref 0.4–1.5)
GFR: 110.37 mL/min (ref >60.00)
Glucose, Bld: 85 mg/dL (ref 70–99)
Potassium: 4.3 mEq/L (ref 3.5–5.1)
Sodium: 137 mEq/L (ref 135–145)

## 2013-09-09 LAB — LIPID PANEL
Cholesterol: 167 mg/dL (ref 0–200)
HDL: 55.4 mg/dL (ref >39.00)
LDL Cholesterol: 85 mg/dL (ref 0–99)
Total CHOL/HDL Ratio: 3
Triglycerides: 132 mg/dL (ref 0.0–149.0)
VLDL: 26.4 mg/dL (ref 0.0–40.0)

## 2013-09-09 LAB — PSA: PSA: 1.56 ng/mL (ref 0.10–4.00)

## 2013-09-09 LAB — TSH: TSH: 1.15 u[IU]/mL (ref 0.35–5.50)

## 2013-09-09 NOTE — Progress Notes (Signed)
   Subjective:    Patient ID: Jeremy Klein, male    DOB: 1940-11-23, 73 y.o.   MRN: [id number]  HPI Medicare Wellness Visit: Psychosocial and medical history were reviewed as required by Medicare (history related to abuse, antisocial behavior , firearm risk). Social history: Caffeine: 2 cups coffee/day, Alcohol: 2-3 drinks/day  , Tobacco YTK:PTWSFK smoker 20 years; 1ppd/5 years Exercise: walking daily, 15-20 minutes Personal safety/fall risk: Limitations of activities of daily living: Seatbelt/ smoke alarm use: yes Healthcare Power of Attorney/Living Will status: yes Ophthalmologic exam status: twice annually; Dr. Albertine Patricia  Hearing evaluation status: Orientation: Oriented X 4 Memory and recall: 2/3 recall Spelling or math testing: spelling  Depression/anxiety assessment:  Foreign travel history: [place], within last 12 months Immunization status for influenza/pneumonia/ shingles /tetanus: up to date Transfusion history:  Preventive health care maintenance status:  Colonoscopy per protocol/standard care: within last 6 months; polyps removed. Sharlett Iles. 5 year f/u  Dental care: every 6 months  Chart reviewed and updated. Active issues reviewed and addressed as documented below.  Review of Systems Denies acute stress/injury; denies palpitations,     Objective:   Physical Exam Gen.: Healthy and well-nourished in appearance. Alert, appropriate and cooperative throughout exam. Appears younger than stated age  Head: Normocephalic without obvious abnormalities; full, thick hair distribution  Eyes: No corneal or conjunctival inflammation noted. Pupils equal round reactive to light and accommodation. Extraocular motion intact. Vision grossly normal with lenses Ears: External  ear exam reveals no significant lesions or deformities. Canals clear .TMs normal. Hearing is grossly normal bilaterally. Nose: External nasal exam reveals no deformity or inflammation. Nasal mucosa are pink and moist.  No lesions or exudates noted.   Mouth: Oral mucosa and oropharynx reveal no lesions or exudates. Teeth in good repair. Neck: No deformities, masses, or tenderness noted. Range of motion WNL. Thyroid palpable. Lungs: Normal respiratory effort; chest expands symmetrically. Lungs are clear to auscultation without rales, wheezes, or increased work of breathing. Heart: Normal rate and rhythm. Normal S1 and S2. No gallop, click, or rub. No murmur. Abdomen: Bowel sounds normal; abdomen soft and nontender. No masses, organomegaly or hernias noted. Genitalia:  MALE: Musculoskeletal/extremities: No deformity or scoliosis noted of  the thoracic or lumbar spine.  No clubbing, cyanosis, edema, or significant extremity  deformity noted. Range of motion normal .Tone & strength normal. Hand joints normal with mild DIP changes.  Fingernail health good. Able to lie down & sit up w/o help.  Vascular: Carotid, radial artery, dorsalis pedis and  posterior tibial pulses are full and equal. No bruits present. Neurologic: Alert and oriented x3. Gait normal  including heel & toe walking.      Skin: Intact without suspicious lesions or rashes. Lymph: No cervical, axillary, or inguinal lymphadenopathy present. Psych: Mood and affect are normal. Normally interactive.                                                                                      Assessment & Plan:  #1 Medicare Wellness Exam; criteria met ; data entered #2 Problem List/Diagnoses reviewed Plan:  Assessments made/ Orders entered

## 2013-09-09 NOTE — Progress Notes (Signed)
Pre visit review using our clinic review tool, if applicable. No additional management support is needed unless otherwise documented below in the visit note. 

## 2013-09-09 NOTE — Patient Instructions (Signed)

## 2013-09-09 NOTE — Progress Notes (Signed)
   Subjective:    Patient ID: Jeremy Klein, male    DOB: February 24, 1941, 73 y.o.   MRN: [id number]  HPI Medicare Wellness Visit:  Psychosocial and medical history were reviewed as required by Medicare (history related to abuse, antisocial behavior , firearm risk).  Social history: Caffeine: 2 cups coffee/day, Alcohol: 2-3 drinks/day , Tobacco YDX:AJOINO smoker 20 years; max of 1ppd over 5 years  Exercise: walking daily, 15-20 minutes  Personal safety/fall risk: no Limitations of activities of daily living: no Seatbelt/ smoke alarm use: yes  Healthcare Power of Attorney/Living Will status: yes  Ophthalmologic exam status: twice annually; Dr. Albertine Patricia  Hearing evaluation status: not current Orientation: Oriented X 4  Memory and recall: 2/3 recall  Spelling or math testing: spelling good Depression/anxiety assessment: no Foreign travel history: [place], within last 12 months  Immunization status for influenza/pneumonia/ shingles /tetanus: PNA givenTransfusion history: never Preventive health care maintenance status: UTD Colonoscopy per protocol/standard care: within last 6 months; polyps removed. Sharlett Iles. 5 year f/u  Dental care: every 6 months  Chart reviewed and updated. Active issues reviewed and addressed as documented below.   Review of Systems   A heart healthy diet is followed. Family history is negative for premature coronary disease. Medication compliance with the statin.  Full dose ASA taken Specifically denied are  chest pain, palpitations, dyspnea, or claudication.  Significant abdominal symptoms, memory deficit, or myalgias not present. BP not monitored @ home.      Objective:   Physical Exam Gen.: Healthy and well-nourished in appearance. Alert, appropriate and cooperative throughout exam.  Appears younger than stated age  Head: Normocephalic without obvious abnormalities; full hair distribution Eyes: No corneal or conjunctival inflammation noted. Pupils equal round  reactive to light and accommodation. Extraocular motion intact.  Vision grossly normal with /w/o lenses  Ears: External ear exam reveals no significant lesions or deformities. Canals clear .TMs normal. Hearing is grossly normal bilaterally.  Nose: External nasal exam reveals no deformity or inflammation. Nasal mucosa are pink and moist. No lesions or exudates noted.  Mouth: Oral mucosa and oropharynx reveal no lesions or exudates. Teeth in good repair.  Neck: No deformities, masses, or tenderness noted. Range of motion WNL. Thyroid normal.  Lungs: Normal respiratory effort; chest expands symmetrically. Lungs are clear to auscultation without rales, wheezes, or increased work of breathing.  Heart: Normal rate and rhythm. Normal S1 and S2. No gallop, click, or rub. No murmur.  Abdomen: Bowel sounds normal; abdomen soft and nontender. No masses, organomegaly or hernias noted.  Genitalia:  normal except for left varices. Prostate enlargement, asymmetry  R > L.  Musculoskeletal :There is no asymmetry of the posterior thoracic musculature. No clubbing, cyanosis, edema, or significant extremity deformity noted. Range of motion normal .Tone & strength normal.  Hand joints reveal mild DIP changes.  Fingernail health good.  Able to lie down & sit up w/o help. Negative SLR bilaterally. Vascular: Carotid, radial artery, dorsalis pedis and posterior tibial pulses are full and equal. No bruits present.  Neurologic: Alert and oriented x3. Deep tendon reflexes symmetrical and normal.  Gait normal Skin: Intact without suspicious lesions or rashes.  Lymph: No cervical, axillary, or inguinal lymphadenopathy present.  Psych: Mood and affect are normal. Normally interactive     Assessment & Plan:  #1 Medicare Wellness Exam; criteria met ; data entered  #2 Problem List/Diagnoses reviewed  Plan: Assessments made/ Orders entered

## 2013-10-31 ENCOUNTER — Other Ambulatory Visit: Payer: Self-pay | Admitting: Internal Medicine

## 2013-11-06 ENCOUNTER — Ambulatory Visit (INDEPENDENT_AMBULATORY_CARE_PROVIDER_SITE_OTHER): Payer: Medicare Other | Admitting: Internal Medicine

## 2013-11-06 ENCOUNTER — Encounter: Payer: Self-pay | Admitting: Internal Medicine

## 2013-11-06 VITALS — BP 145/77 | HR 66 | Ht 72.0 in | Wt 218.0 lb

## 2013-11-06 DIAGNOSIS — I2510 Atherosclerotic heart disease of native coronary artery without angina pectoris: Secondary | ICD-10-CM

## 2013-11-06 DIAGNOSIS — R55 Syncope and collapse: Secondary | ICD-10-CM

## 2013-11-06 DIAGNOSIS — I10 Essential (primary) hypertension: Secondary | ICD-10-CM | POA: Diagnosis not present

## 2013-11-06 NOTE — Progress Notes (Signed)
HPI Patient is a 73 year old with a history of mitral valve prolapse (s/p repair at [place] in 2003), CAD (moderate of LAD), dyslipidemia  He was last in cardiology clinic in April 2014  Last lipid panel Since seen he has done well  No CP  No SOB  Has lost a few pounds  Walking afew days per week. No Known Allergies  Current Outpatient Prescriptions  Medication Sig Dispense Refill  . amLODipine (NORVASC) 5 MG tablet TAKE 1 TABLET DAILY  90 tablet  0  . aspirin 325 MG tablet Take 325 mg by mouth daily.        Marland Kitchen atorvastatin (LIPITOR) 40 MG tablet Take 1 tablet (40 mg total) by mouth daily.  90 tablet  3  . Multiple Vitamins-Minerals (ICAPS PO) Take by mouth. 2 by mouth in the am, 2 by mouth in the pm       . Saw Palmetto 500 MG CAPS Take by mouth daily.         No current facility-administered medications for this visit.    Past Medical History  Diagnosis Date  . Macular degeneration     Dr Cordelia Pen, [facility]  . Mitral valve prolapse     S/P  repair 2003  . Colonic polyp     tubular & hyperplasticDr Sharlett Iles  . Hyperlipidemia   . Low back pain     intermittent  . Coronary artery disease     Dr Harrington Challenger; not significant    Past Surgical History  Procedure Laterality Date  . Appendectomy    . Cardiac catheterization  2003  . Colonoscopy w/ polypectomy  09/2012     Dr [facility]  . Mv repair  2003    Dr Amador Cunas, [facility]    Family History  Problem Relation Age of Onset  . Heart attack Father 2  . Prostate cancer Father 70  . Skin cancer Father     Lip  . Hypertension Mother 49  . Diabetes Neg Hx   . Stroke Neg Hx   . Colon cancer Neg Hx     History   Social History  . Marital Status: Married    Spouse Name: N/A    Number of Children: N/A  . Years of Education: N/A   Occupational History  . federal judge    Social History Main Topics  . Smoking status: Former Smoker    Quit date: 07/16/1978  . Smokeless tobacco: Never Used     Comment: smoked 1958-1980, up to  1 ppd for 5 years  . Alcohol Use: 1.8 oz/week    3 Shots of liquor per week     Comment: socially  . Drug Use: No  . Sexual Activity: Not on file   Other Topics Concern  . Not on file   Social History Narrative  . No narrative on file    Review of Systems:  All systems reviewed.  They are negative to the above problem except as previously stated.  Vital Signs: BP 145/77  Pulse 66  Ht 6' (1.829 m)  Wt 218 lb (98.884 kg)  BMI 29.56 kg/m2  Physical Exam Patient is in NAD HEENT:  Normocephalic, atraumatic. EOMI, PERRLA.  Neck: JVP is normal.  No bruits.  Lungs: clear to auscultation. No rales no wheezes.  Heart: Regular rate and rhythm. Normal S1, S2. No S3.   No significant murmurs. PMI not displaced.  Abdomen:  Supple, nontender. Normal bowel sounds. No masses. No hepatomegaly.  Extremities:  Good distal pulses throughout. No lower extremity edema.  Musculoskeletal :moving all extremities.  Neuro:   alert and oriented x3.  CN II-XII grossly intact.  EKG Assessment and Plan:  1.  CAD   Remains asymptomatic  2.  HTN  BP is high on my check     3.  HL  Good control at last check in Feb 2015  LDL was 85, HDL 55  4.  S/p MV repair  Execellent result.

## 2013-11-06 NOTE — Patient Instructions (Signed)
Your physician wants you to follow-up in: 1 year with Dr. Ross.  You will receive a reminder letter in the mail two months in advance. If you don't receive a letter, please call our office to schedule the follow-up appointment.  

## 2013-11-16 ENCOUNTER — Other Ambulatory Visit: Payer: Self-pay | Admitting: Internal Medicine

## 2013-11-25 DIAGNOSIS — Y92009 Unspecified place in unspecified non-institutional (private) residence as the place of occurrence of the external cause: Secondary | ICD-10-CM | POA: Diagnosis not present

## 2013-11-25 DIAGNOSIS — H109 Unspecified conjunctivitis: Secondary | ICD-10-CM | POA: Diagnosis not present

## 2013-11-25 DIAGNOSIS — S6000XA Contusion of unspecified finger without damage to nail, initial encounter: Secondary | ICD-10-CM | POA: Diagnosis not present

## 2013-12-02 DIAGNOSIS — H01009 Unspecified blepharitis unspecified eye, unspecified eyelid: Secondary | ICD-10-CM | POA: Diagnosis not present

## 2013-12-02 DIAGNOSIS — H04129 Dry eye syndrome of unspecified lacrimal gland: Secondary | ICD-10-CM | POA: Diagnosis not present

## 2013-12-31 DIAGNOSIS — H01009 Unspecified blepharitis unspecified eye, unspecified eyelid: Secondary | ICD-10-CM | POA: Diagnosis not present

## 2013-12-31 DIAGNOSIS — H353190 Nonexudative age-related macular degeneration, unspecified eye, stage unspecified: Secondary | ICD-10-CM | POA: Diagnosis not present

## 2013-12-31 DIAGNOSIS — H2510 Age-related nuclear cataract, unspecified eye: Secondary | ICD-10-CM | POA: Diagnosis not present

## 2013-12-31 DIAGNOSIS — H04129 Dry eye syndrome of unspecified lacrimal gland: Secondary | ICD-10-CM | POA: Diagnosis not present

## 2014-01-25 ENCOUNTER — Other Ambulatory Visit: Payer: Self-pay | Admitting: Internal Medicine

## 2014-02-04 ENCOUNTER — Other Ambulatory Visit: Payer: Self-pay | Admitting: Internal Medicine

## 2014-02-24 ENCOUNTER — Encounter: Payer: Self-pay | Admitting: Gastroenterology

## 2014-04-30 ENCOUNTER — Other Ambulatory Visit: Payer: Self-pay

## 2014-05-01 ENCOUNTER — Emergency Department (INDEPENDENT_AMBULATORY_CARE_PROVIDER_SITE_OTHER)
Admission: EM | Admit: 2014-05-01 | Discharge: 2014-05-01 | Disposition: A | Discharge status: Home / Self Care | Payer: Medicare Other | Source: Home / Self Care | Attending: Family Medicine | Admitting: Family Medicine

## 2014-05-01 ENCOUNTER — Encounter (HOSPITAL_COMMUNITY): Payer: Self-pay | Admitting: Emergency Medicine

## 2014-05-01 DIAGNOSIS — R197 Diarrhea, unspecified: Secondary | ICD-10-CM

## 2014-05-01 LAB — POCT I-STAT, CHEM 8
BUN: 11 mg/dL (ref 6–23)
Calcium, Ion: 1.18 mmol/L (ref 1.13–1.30)
Chloride: 102 meq/L (ref 96–112)
Creatinine, Ser: 1.1 mg/dL (ref 0.50–1.35)
Glucose, Bld: 108 mg/dL — ABNORMAL HIGH (ref 70–99)
HCT: 52 % (ref 39.0–52.0)
Hemoglobin: 17.7 g/dL — ABNORMAL HIGH (ref 13.0–17.0)
Potassium: 4.2 meq/L (ref 3.7–5.3)
Sodium: 139 meq/L (ref 137–147)
TCO2: 24 mmol/L (ref 0–100)

## 2014-05-01 MED ORDER — LOPERAMIDE HCL 2 MG PO CAPS: 2.0000 mg | ORAL_CAPSULE | Freq: Four times a day (QID) | ORAL | Status: DC | PRN

## 2014-05-01 NOTE — Discharge Instructions (Signed)

## 2014-05-01 NOTE — ED Notes (Signed)
73 year old man with Diarrhea.  Has traveled to [place] with any issues.  He states he ate [place]  2 days ago.  He then started having diarrhea,  Stool is  Non bloody.  Patient denies and nausea, vomiting, abdominal pain,or dizziness.

## 2014-05-01 NOTE — ED Provider Notes (Signed)
Medical screening examination/treatment/procedure(s) were performed by resident physician or non-physician practitioner and as supervising physician I was immediately available for consultation/collaboration.   Pauline Good MD.   Billy Fischer, MD 05/01/14 [phone]

## 2014-05-01 NOTE — ED Provider Notes (Signed)
CSN: [id number]     Arrival date & time 05/01/14  1004 History   First MD Initiated Contact with Patient 05/01/14 1020     Chief Complaint  Patient presents with  . Diarrhea   (Consider location/radiation/quality/duration/timing/severity/associated sxs/prior Treatment) HPI    73 year old male presents for evaluation of diarrhea. For 2 days he has had multiple episodes of watery diarrhea daily. He denies any other associated symptoms. He notes that he just returned from [place] 2 days before this began. He did not have any GI issues while in pain or immediately after returning, but this started after he ate [place] food on Thursday. He has not attempted any treatment at home. He denies any nausea, vomiting, fever, chills, NVD. Diarrhea is nonbloody.  Past Medical History  Diagnosis Date  . Macular degeneration     Dr Cordelia Pen, [facility]  . Mitral valve prolapse     S/P  repair 2003  . Colonic polyp     tubular & hyperplasticDr Sharlett Iles  . Hyperlipidemia   . Low back pain     intermittent  . Coronary artery disease     Dr Harrington Challenger; not significant   Past Surgical History  Procedure Laterality Date  . Appendectomy    . Cardiac catheterization  2003  . Colonoscopy w/ polypectomy  09/2012     Dr [facility]  . Mv repair  2003    Dr Amador Cunas, [facility]   Family History  Problem Relation Age of Onset  . Heart attack Father 53  . Prostate cancer Father 76  . Skin cancer Father     Lip  . Hypertension Mother 74  . Diabetes Neg Hx   . Stroke Neg Hx   . Colon cancer Neg Hx    History  Substance Use Topics  . Smoking status: Former Smoker    Quit date: 07/16/1978  . Smokeless tobacco: Never Used     Comment: smoked 1958-1980, up to 1 ppd for 5 years  . Alcohol Use: 1.8 oz/week    3 Shots of liquor per week     Comment: socially    Review of Systems  Gastrointestinal: Positive for diarrhea.  All other systems reviewed and are negative.   Allergies  Review of patient's  allergies indicates no known allergies.  Home Medications   Prior to Admission medications   Medication Sig Start Date End Date Taking? Authorizing Provider  amLODipine (NORVASC) 5 MG tablet Take 1 tablet (5 mg total) by mouth daily.    Fay Records, MD  aspirin 325 MG tablet Take 325 mg by mouth daily.      Historical Provider, MD  LIPITOR 40 MG tablet TAKE 1 TABLET DAILY    Fay Records, MD  LIPITOR 40 MG tablet TAKE 1 TABLET DAILY    Fay Records, MD  loperamide (IMODIUM) 2 MG capsule Take 1 capsule (2 mg total) by mouth 4 (four) times daily as needed for diarrhea or loose stools. 05/01/14   Freeman Caldron Quintus Premo, PA-C  Multiple Vitamins-Minerals (ICAPS PO) Take by mouth. 2 by mouth in the am, 2 by mouth in the pm     Historical Provider, MD  Saw Palmetto 500 MG CAPS Take by mouth daily.      Historical Provider, MD   BP 100/63  Pulse 86  Temp(Src) 97.7 F (36.5 C) (Oral)  Resp 16  SpO2 99% Physical Exam  Nursing note and vitals reviewed. Constitutional: He is oriented to person, place, and time. He  appears well-developed and well-nourished. No distress.  HENT:  Head: Normocephalic and atraumatic.  Right Ear: External ear normal.  Left Ear: External ear normal.  Nose: Nose normal.  Mouth/Throat: Oropharynx is clear and moist. No oropharyngeal exudate.  Cardiovascular: Normal rate, regular rhythm, normal heart sounds and intact distal pulses.   Pulmonary/Chest: Effort normal and breath sounds normal. No respiratory distress.  Abdominal: Soft. Bowel sounds are normal. He exhibits no distension and no mass. There is no tenderness. There is no rebound and no guarding.  Neurological: He is alert and oriented to person, place, and time. Coordination normal.  Skin: Skin is warm and dry. No rash noted. He is not diaphoretic.  Psychiatric: He has a normal mood and affect. Judgment normal.    ED Course  Procedures (including critical care time) Labs Review Labs Reviewed  POCT I-STAT, CHEM  8 - Abnormal; Notable for the following:    Glucose, Bld 108 (*)    Hemoglobin 17.7 (*)    All other components within normal limits    Imaging Review No results found.   MDM   1. Diarrhea    I-STAT is normal. Patient is not orthostatic. We'll treat her medically for now with Imodium. If this persists for one more week or becomes bloody he will return here or to his primary care doctor for stool studies.   Meds ordered this encounter  Medications  . loperamide (IMODIUM) 2 MG capsule    Sig: Take 1 capsule (2 mg total) by mouth 4 (four) times daily as needed for diarrhea or loose stools.    Dispense:  12 capsule    Refill:  0    Order Specific Question:  Supervising Provider    Answer:  Ihor Gully D Salem, PA-C 05/01/14 1104

## 2014-06-22 ENCOUNTER — Other Ambulatory Visit: Payer: Self-pay | Admitting: Dermatology

## 2014-06-22 DIAGNOSIS — L821 Other seborrheic keratosis: Secondary | ICD-10-CM | POA: Diagnosis not present

## 2014-06-22 DIAGNOSIS — D485 Neoplasm of uncertain behavior of skin: Secondary | ICD-10-CM | POA: Diagnosis not present

## 2014-06-22 DIAGNOSIS — D1801 Hemangioma of skin and subcutaneous tissue: Secondary | ICD-10-CM | POA: Diagnosis not present

## 2014-07-30 ENCOUNTER — Ambulatory Visit (INDEPENDENT_AMBULATORY_CARE_PROVIDER_SITE_OTHER): Payer: Medicare Other | Admitting: Internal Medicine

## 2014-07-30 ENCOUNTER — Other Ambulatory Visit (INDEPENDENT_AMBULATORY_CARE_PROVIDER_SITE_OTHER): Payer: Medicare Other

## 2014-07-30 ENCOUNTER — Encounter: Payer: Self-pay | Admitting: Internal Medicine

## 2014-07-30 VITALS — BP 126/82 | HR 68 | Temp 97.6°F | Ht 72.0 in | Wt 220.5 lb

## 2014-07-30 DIAGNOSIS — R102 Pelvic and perineal pain: Secondary | ICD-10-CM

## 2014-07-30 DIAGNOSIS — N509 Disorder of male genital organs, unspecified: Secondary | ICD-10-CM

## 2014-07-30 DIAGNOSIS — Z8042 Family history of malignant neoplasm of prostate: Secondary | ICD-10-CM

## 2014-07-30 LAB — URINALYSIS
Bilirubin Urine: NEGATIVE
Hgb urine dipstick: NEGATIVE
Ketones, ur: NEGATIVE
Leukocytes, UA: NEGATIVE
Nitrite: NEGATIVE
Specific Gravity, Urine: >=1.03 — AB (ref 1.000–1.030)
Total Protein, Urine: NEGATIVE
Urine Glucose: NEGATIVE
Urobilinogen, UA: 0.2 (ref 0.0–1.0)
pH: 5.5 (ref 5.0–8.0)

## 2014-07-30 LAB — PSA: PSA: 1.77 ng/mL (ref 0.10–4.00)

## 2014-07-30 NOTE — Progress Notes (Signed)
   Subjective:    Patient ID: Jeremy Klein, male    DOB: 01/30/1941, 74 y.o.   MRN: [id number]  HPI   He describes intermittent, dull perineal pain up to level II over the last 2 weeks. There was no specific trigger for this. He did decrease his spicy food intake with no significant change. Prolonged sitting may aggravated  He has no new or significant  genitourinary symptoms or past medical history. He has had chronic nocturia 2 which has not changed. He also describes some chronic low back discomfort. This has not increased with present illness.  His father did have prostate cancer  GI symptoms are also negative. Colonoscopy is up-to-date.  Review of Systems Dysuria, pyuria, hematuria, frequency, nocturia or polyuria are denied.Unexplained weight loss, abdominal pain, significant dyspepsia, dysphagia, melena, rectal bleeding, or persistently small caliber stools are denied.    Objective:   Physical Exam    Gen.: Healthy and well-nourished in appearance. Alert, appropriate and cooperative throughout exam. Appears younger than stated age  Eyes: No corneal or conjunctival inflammation noted.  Mouth: Oral mucosa and oropharynx reveal no lesions or exudates. Teeth in good repair. Neck: No deformities, masses, or tenderness noted. Lungs: Normal respiratory effort; chest expands symmetrically. Lungs are clear to auscultation without rales, wheezes, or increased work of breathing. Heart: Normal rate and rhythm. Normal S1 and S2. No gallop, click, or rub. No murmur. Abdomen: Bowel sounds normal; abdomen soft and nontender. No masses, organomegaly or hernias noted. Genitalia: Genitalia normal except for left varices. Prostate is normal without enlargement, asymmetry, nodularity, or induration                       Musculoskeletal/extremities: No deformity or scoliosis noted of  the thoracic or lumbar spine.  No clubbing, cyanosis, edema, or significant extremity  deformity noted.  Range of  motion normal . Tone & strength normal. Hand joints normal  Fingernail  health good. Able to lie down & sit up w/o help.  Negative SLR bilaterally Vascular: Carotid, radial artery, dorsalis pedis and  posterior tibial pulses are full and equal. No bruits present. Neurologic: Alert and oriented x3. Deep tendon reflexes symmetrical and normal.  Gait normal  Skin: Intact without suspicious lesions or rashes. Lymph: No cervical, axillary, or inguinal lymphadenopathy present. Psych: Mood and affect are normal. Normally interactive                                                                                     Assessment & Plan:  #1 perineal pain #2 FH prostate cancer See orders

## 2014-07-30 NOTE — Patient Instructions (Signed)
Drink as much nondairy fluids as possible. Avoid spicy foods or alcohol as  these may aggravate the bladder / prostate. Do not take decongestants. Avoid narcotics if possible.

## 2014-07-30 NOTE — Progress Notes (Signed)
Pre visit review using our clinic review tool, if applicable. No additional management support is needed unless otherwise documented below in the visit note. 

## 2014-08-01 ENCOUNTER — Other Ambulatory Visit: Payer: Self-pay | Admitting: Internal Medicine

## 2014-09-14 ENCOUNTER — Encounter: Payer: Self-pay | Admitting: Internal Medicine

## 2014-10-11 ENCOUNTER — Encounter: Payer: Self-pay | Admitting: Internal Medicine

## 2014-11-01 ENCOUNTER — Other Ambulatory Visit: Payer: Self-pay | Admitting: Internal Medicine

## 2014-11-02 ENCOUNTER — Encounter: Payer: Self-pay | Admitting: Internal Medicine

## 2014-11-08 ENCOUNTER — Encounter: Payer: Self-pay | Admitting: Internal Medicine

## 2014-11-08 ENCOUNTER — Ambulatory Visit (INDEPENDENT_AMBULATORY_CARE_PROVIDER_SITE_OTHER): Payer: Medicare Other | Admitting: Internal Medicine

## 2014-11-08 VITALS — BP 118/78 | HR 62 | Ht 72.0 in | Wt 221.6 lb

## 2014-11-08 DIAGNOSIS — E785 Hyperlipidemia, unspecified: Secondary | ICD-10-CM | POA: Diagnosis not present

## 2014-11-08 DIAGNOSIS — I10 Essential (primary) hypertension: Secondary | ICD-10-CM | POA: Diagnosis not present

## 2014-11-08 NOTE — Patient Instructions (Addendum)
Your physician recommends that you continue on your current medications as directed. Please refer to the Current Medication list given to you today.  Your physician recommends that you return for lab work (CBC, CMET, LIPID, TSH)  Your physician wants you to follow-up in: [place].  You will receive a reminder letter in the mail two months in advance. If you don't receive a letter, please call our office to schedule the follow-up appointment.

## 2014-11-08 NOTE — Progress Notes (Signed)
Cardiology Office Note   Date:  11/08/2014   ID:  Jeremy Klein, DOB Feb 08, 1941, MRN [id number]  PCP:  Unice Cobble, MD  Cardiologist:   [name], MD   No chief complaint on file. f/u of MV disease      History of Present Illness: Jeremy Klein is a 74 y.o. male with a history of MVP (s/p repair at [place] in 2003), CAD (moderate dz LAD in 2003), HL   I saw him in clinic in 2015 SInce seen he has done ok from cardiac standpoint  Dneis CP  No SOB  No dizziness  No Palpitations. The patinet is having some perineal pain  Has appt with urology tomorrow Wife Jeremy Klein is undergoing Rx for uterine Ca.     Current Outpatient Prescriptions  Medication Sig Dispense Refill  . amLODipine (NORVASC) 5 MG tablet TAKE 1 TABLET DAILY 90 tablet 0  . aspirin 325 MG tablet Take 325 mg by mouth daily.      Marland Kitchen LIPITOR 40 MG tablet TAKE 1 TABLET DAILY 90 tablet 3  . Multiple Vitamins-Minerals (ICAPS PO) Take by mouth. 2 by mouth in the am, 2 by mouth in the pm     . Saw Palmetto 500 MG CAPS Take by mouth daily.       No current facility-administered medications for this visit.    Allergies:   Review of patient's allergies indicates no known allergies.   Past Medical History  Diagnosis Date  . Macular degeneration     Dr Cordelia Pen, [facility]  . Mitral valve prolapse     S/P  repair 2003  . Colonic polyp     tubular & hyperplasticDr Sharlett Iles  . Hyperlipidemia   . Low back pain     intermittent  . Coronary artery disease     Dr Harrington Challenger; not significant    Past Surgical History  Procedure Laterality Date  . Appendectomy    . Cardiac catheterization  2003  . Colonoscopy w/ polypectomy  09/2012     Dr [facility]  . Mv repair  2003    Dr Amador Cunas, [facility]     Social History:  The patient  reports that he quit smoking about 36 years ago. He has never used smokeless tobacco. He reports that he drinks about 1.8 oz of alcohol per week. He reports that he does not use illicit drugs.    Family History:  The patient's family history includes Heart attack (age of onset: 53) in his father; Hypertension (age of onset: 37) in his mother; Prostate cancer (age of onset: 25) in his father; Skin cancer in his father. There is no history of Diabetes, Stroke, or Colon cancer.    ROS:  Please see the history of present illness. All other systems are reviewed and  Negative to the above problem except as noted.    PHYSICAL EXAM: VS:  BP 118/78 mmHg  Pulse 62  Ht 6' (1.829 m)  Wt 221 lb 9.6 oz (100.517 kg)  BMI 30.05 kg/m2  GEN: Well nourished, well developed, in no acute distress HEENT: normal Neck: no JVD, carotid bruits, or masses Cardiac: RRR; no murmurs, rubs, or gallops,no edema  Respiratory:  clear to auscultation bilaterally, normal work of breathing GI: soft, nontender, nondistended, + BS  No hepatomegaly  MS: no deformity Moving all extremities   Skin: warm and dry, no rash Neuro:  Strength and sensation are intact Psych: euthymic mood, full affect   EKG:  EKG is  ordered today.  SR 62 bpm     Lipid Panel    Component Value Date/Time   CHOL 167 09/09/2013 1136   TRIG 132.0 09/09/2013 1136   TRIG 81 06/14/2006 1056   HDL 55.40 09/09/2013 1136   CHOLHDL 3 09/09/2013 1136   CHOLHDL 3.0 CALC 06/14/2006 1056   VLDL 26.4 09/09/2013 1136   LDLCALC 85 09/09/2013 1136      Wt Readings from Last 3 Encounters:  11/08/14 221 lb 9.6 oz (100.517 kg)  07/30/14 220 lb 8 oz (100.018 kg)  11/06/13 218 lb (98.884 kg)      ASSESSMENT AND PLAN:  1.  S/P MV repair  Excellent result from remote repair   No murmurs  2  CAD  No symtpoms to suggest angina  Stay acitve  3.  HL  Will set up for repeat labs  Check CMET, TSH and CBC at that time    F/U in 1 year    Signed, [name], MD  11/08/2014 12:15 PM    [address], [place],   [address] Phone: [phone]; Fax: [phone]

## 2014-11-09 DIAGNOSIS — N509 Disorder of male genital organs, unspecified: Secondary | ICD-10-CM | POA: Diagnosis not present

## 2014-11-15 ENCOUNTER — Other Ambulatory Visit: Payer: Self-pay | Admitting: Internal Medicine

## 2014-11-16 DIAGNOSIS — N509 Disorder of male genital organs, unspecified: Secondary | ICD-10-CM | POA: Diagnosis not present

## 2014-11-22 ENCOUNTER — Other Ambulatory Visit (INDEPENDENT_AMBULATORY_CARE_PROVIDER_SITE_OTHER): Payer: Medicare Other

## 2014-11-22 DIAGNOSIS — E785 Hyperlipidemia, unspecified: Secondary | ICD-10-CM | POA: Diagnosis not present

## 2014-11-22 DIAGNOSIS — I10 Essential (primary) hypertension: Secondary | ICD-10-CM

## 2014-11-22 LAB — LIPID PANEL
Cholesterol: 156 mg/dL (ref 0–200)
HDL: 54.6 mg/dL (ref >39.00)
LDL Cholesterol: 80 mg/dL (ref 0–99)
NonHDL: 101.4
Total CHOL/HDL Ratio: 3
Triglycerides: 108 mg/dL (ref 0.0–149.0)
VLDL: 21.6 mg/dL (ref 0.0–40.0)

## 2014-11-22 LAB — COMPREHENSIVE METABOLIC PANEL
ALT: 16 U/L (ref 0–53)
AST: 15 U/L (ref 0–37)
Albumin: 3.9 g/dL (ref 3.5–5.2)
Alkaline Phosphatase: 63 U/L (ref 39–117)
BUN: 17 mg/dL (ref 6–23)
CO2: 26 mEq/L (ref 19–32)
Calcium: 9.2 mg/dL (ref 8.4–10.5)
Chloride: 106 mEq/L (ref 96–112)
Creatinine, Ser: 0.76 mg/dL (ref 0.40–1.50)
GFR: 106.67 mL/min (ref >60.00)
Glucose, Bld: 97 mg/dL (ref 70–99)
Potassium: 4.1 mEq/L (ref 3.5–5.1)
Sodium: 138 mEq/L (ref 135–145)
Total Bilirubin: 0.6 mg/dL (ref 0.2–1.2)
Total Protein: 6.6 g/dL (ref 6.0–8.3)

## 2014-11-22 LAB — CBC
HCT: 45.2 % (ref 39.0–52.0)
Hemoglobin: 15.6 g/dL (ref 13.0–17.0)
MCHC: 34.5 g/dL (ref 30.0–36.0)
MCV: 88.1 fl (ref 78.0–100.0)
Platelets: 200 10*3/uL (ref 150.0–400.0)
RBC: 5.13 Mil/uL (ref 4.22–5.81)
RDW: 14.5 % (ref 11.5–15.5)
WBC: 5.2 10*3/uL (ref 4.0–10.5)

## 2014-11-22 LAB — TSH: TSH: 2.21 u[IU]/mL (ref 0.35–4.50)

## 2014-11-29 ENCOUNTER — Telehealth: Payer: Self-pay | Admitting: Internal Medicine

## 2014-11-29 NOTE — Telephone Encounter (Signed)
New message ° ° ° ° °Returning a nurses call to get lab results °

## 2014-11-29 NOTE — Telephone Encounter (Signed)
Notified of lab results. 

## 2015-01-06 ENCOUNTER — Emergency Department (INDEPENDENT_AMBULATORY_CARE_PROVIDER_SITE_OTHER)
Admission: EM | Admit: 2015-01-06 | Discharge: 2015-01-06 | Disposition: A | Discharge status: Home / Self Care | Payer: Medicare Other | Source: Home / Self Care | Attending: Family Medicine | Admitting: Family Medicine

## 2015-01-06 ENCOUNTER — Encounter (HOSPITAL_COMMUNITY): Payer: Self-pay | Admitting: Emergency Medicine

## 2015-01-06 DIAGNOSIS — H01006 Unspecified blepharitis left eye, unspecified eyelid: Secondary | ICD-10-CM | POA: Diagnosis not present

## 2015-01-06 DIAGNOSIS — S61219A Laceration without foreign body of unspecified finger without damage to nail, initial encounter: Secondary | ICD-10-CM | POA: Diagnosis not present

## 2015-01-06 DIAGNOSIS — H2513 Age-related nuclear cataract, bilateral: Secondary | ICD-10-CM | POA: Diagnosis not present

## 2015-01-06 DIAGNOSIS — H01003 Unspecified blepharitis right eye, unspecified eyelid: Secondary | ICD-10-CM | POA: Diagnosis not present

## 2015-01-06 DIAGNOSIS — Z23 Encounter for immunization: Secondary | ICD-10-CM

## 2015-01-06 DIAGNOSIS — H04123 Dry eye syndrome of bilateral lacrimal glands: Secondary | ICD-10-CM | POA: Diagnosis not present

## 2015-01-06 DIAGNOSIS — H3531 Nonexudative age-related macular degeneration: Secondary | ICD-10-CM | POA: Diagnosis not present

## 2015-01-06 MED ORDER — TETANUS-DIPHTH-ACELL PERTUSSIS 5-2.5-18.5 LF-MCG/0.5 IM SUSP
0.5000 mL | Freq: Once | INTRAMUSCULAR | Status: AC
  Administered 2015-01-06: 0.5 mL via INTRAMUSCULAR

## 2015-01-06 MED ORDER — TETANUS-DIPHTH-ACELL PERTUSSIS 5-2.5-18.5 LF-MCG/0.5 IM SUSP
INTRAMUSCULAR | Status: AC
  Filled 2015-01-06: qty 0.5

## 2015-01-06 NOTE — ED Provider Notes (Signed)
Jeremy Klein is a 74 y.o. male who presents to Urgent Care today for laceration. Patient was at his home today when his left third digit on his hand became pinched in a folding garage door. He suffered a laceration at the palmar DIP. He thinks his last tetanus vaccine was 5 years ago. Feels well otherwise no fevers or chills. No weakness or numbness.   Past Medical History  Diagnosis Date  . Macular degeneration     Dr Cordelia Pen, [facility]  . Mitral valve prolapse     S/P  repair 2003  . Colonic polyp     tubular & hyperplasticDr Sharlett Iles  . Hyperlipidemia   . Low back pain     intermittent  . Coronary artery disease     Dr Harrington Challenger; not significant   Past Surgical History  Procedure Laterality Date  . Appendectomy    . Cardiac catheterization  2003  . Colonoscopy w/ polypectomy  09/2012     Dr [facility]  . Mv repair  2003    Dr Amador Cunas, [facility]   History  Substance Use Topics  . Smoking status: Former Smoker    Quit date: 07/16/1978  . Smokeless tobacco: Never Used     Comment: smoked 1958-1980, up to 1 ppd for 5 years  . Alcohol Use: 1.8 oz/week    3 Shots of liquor per week     Comment: socially   ROS as above Medications: No current facility-administered medications for this encounter.   Current Outpatient Prescriptions  Medication Sig Dispense Refill  . amLODipine (NORVASC) 5 MG tablet TAKE 1 TABLET DAILY 90 tablet 0  . aspirin 325 MG tablet Take 325 mg by mouth daily.      Marland Kitchen LIPITOR 40 MG tablet TAKE 1 TABLET DAILY 90 tablet 3  . Multiple Vitamins-Minerals (ICAPS PO) Take by mouth. 2 by mouth in the am, 2 by mouth in the pm     . Saw Palmetto 500 MG CAPS Take by mouth daily.       No Known Allergies   Exam:  BP 124/81 mmHg  Pulse 67  Temp(Src) 98.7 F (37.1 C) (Oral)  Resp 16  SpO2 95%  Gen: Well NAD HEENT: EOMI,  MMM Lungs: Normal work of breathing. CTABL Heart: RRR no MRG Abd: NABS, Soft. Nondistended, Nontender Exts: Brisk capillary refill,  warm and well perfused.  Left hand 3rd digit DIP palmar laceration extending through the dermis. No tendon visible.  Intact sensation, cap refill and strength.   Laceration repair: Consent obtained and timeout performed. Volar digital nerve block using 4 L of lidocaine without epinephrine used to anesthetize the finger. Wound copiously irrigated with sure cleanse solution. Surrounding skin prepped with Betadine and the wound was draped in the usual sterile fashion. 5-0 proline used to close the wound using 2 horizontal mattress sutures and one simple interrupted suture. Betadine removed. Anabolic ointment and dressing applied. Patient tolerated the procedure well  No results found for this or any previous visit (from the past 24 hour(s)). No results found.  Assessment and Plan: 74 y.o. male with laceration repaired. Tetanus vaccine provided. Return for suture removal in 7-10 days.  Discussed warning signs or symptoms. Please see discharge instructions. Patient expresses understanding.     Gregor Hams, MD 01/06/15 [phone]

## 2015-01-06 NOTE — Discharge Instructions (Signed)
Thank you for coming in today.  Laceration Care, Adult A laceration is a cut or lesion that goes through all layers of the skin and into the tissue just beneath the skin. TREATMENT  Some lacerations may not require closure. Some lacerations may not be able to be closed due to an increased risk of infection. It is important to see your caregiver as soon as possible after an injury to minimize the risk of infection and maximize the opportunity for successful closure. If closure is appropriate, pain medicines may be given, if needed. The wound will be cleaned to help prevent infection. Your caregiver will use stitches (sutures), staples, wound glue (adhesive), or skin adhesive strips to repair the laceration. These tools bring the skin edges together to allow for faster healing and a better cosmetic outcome. However, all wounds will heal with a scar. Once the wound has healed, scarring can be minimized by covering the wound with sunscreen during the day for 1 full year. HOME CARE INSTRUCTIONS  For sutures or staples:  Keep the wound clean and dry.  If you were given a bandage (dressing), you should change it at least once a day. Also, change the dressing if it becomes wet or dirty, or as directed by your caregiver.  Wash the wound with soap and water 2 times a day. Rinse the wound off with water to remove all soap. Pat the wound dry with a clean towel.  After cleaning, apply a thin layer of the antibiotic ointment as recommended by your caregiver. This will help prevent infection and keep the dressing from sticking.  You may shower as usual after the first 24 hours. Do not soak the wound in water until the sutures are removed.  Only take over-the-counter or prescription medicines for pain, discomfort, or fever as directed by your caregiver.  Get your sutures or staples removed as directed by your caregiver. For skin adhesive strips:  Keep the wound clean and dry.  Do not get the skin adhesive  strips wet. You may bathe carefully, using caution to keep the wound dry.  If the wound gets wet, pat it dry with a clean towel.  Skin adhesive strips will fall off on their own. You may trim the strips as the wound heals. Do not remove skin adhesive strips that are still stuck to the wound. They will fall off in time. For wound adhesive:  You may briefly wet your wound in the shower or bath. Do not soak or scrub the wound. Do not swim. Avoid periods of heavy perspiration until the skin adhesive has fallen off on its own. After showering or bathing, gently pat the wound dry with a clean towel.  Do not apply liquid medicine, cream medicine, or ointment medicine to your wound while the skin adhesive is in place. This may loosen the film before your wound is healed.  If a dressing is placed over the wound, be careful not to apply tape directly over the skin adhesive. This may cause the adhesive to be pulled off before the wound is healed.  Avoid prolonged exposure to sunlight or tanning lamps while the skin adhesive is in place. Exposure to ultraviolet light in the first year will darken the scar.  The skin adhesive will usually remain in place for 5 to 10 days, then naturally fall off the skin. Do not pick at the adhesive film. You may need a tetanus shot if:  You cannot remember when you had your last tetanus shot.  You have never had a tetanus shot. If you get a tetanus shot, your arm may swell, get red, and feel warm to the touch. This is common and not a problem. If you need a tetanus shot and you choose not to have one, there is a rare chance of getting tetanus. Sickness from tetanus can be serious. SEEK MEDICAL CARE IF:   You have redness, swelling, or increasing pain in the wound.  You see a red line that goes away from the wound.  You have yellowish-white fluid (pus) coming from the wound.  You have a fever.  You notice a bad smell coming from the wound or dressing.  Your  wound breaks open before or after sutures have been removed.  You notice something coming out of the wound such as wood or glass.  Your wound is on your hand or foot and you cannot move a finger or toe. SEEK IMMEDIATE MEDICAL CARE IF:   Your pain is not controlled with prescribed medicine.  You have severe swelling around the wound causing pain and numbness or a change in color in your arm, hand, leg, or foot.  Your wound splits open and starts bleeding.  You have worsening numbness, weakness, or loss of function of any joint around or beyond the wound.  You develop painful lumps near the wound or on the skin anywhere on your body. MAKE SURE YOU:   Understand these instructions.  Will watch your condition.  Will get help right away if you are not doing well or get worse. Document Released: 07/02/2005 Document Revised: 09/24/2011 Document Reviewed: 12/26/2010 [facility] Patient Information 2015 [place], [place]. This information is not intended to replace advice given to you by your health care provider. Make sure you discuss any questions you have with your health care provider.

## 2015-01-06 NOTE — ED Notes (Signed)
Reports cut to left middle finger onset 45 minutes ago States he was pulling garage door down when it closed on his finger Last tetanus = unknown Alert, no signs of acute distress.

## 2015-01-10 ENCOUNTER — Other Ambulatory Visit: Payer: Self-pay

## 2015-01-15 ENCOUNTER — Encounter (HOSPITAL_COMMUNITY): Payer: Self-pay | Admitting: Emergency Medicine

## 2015-01-15 ENCOUNTER — Emergency Department (INDEPENDENT_AMBULATORY_CARE_PROVIDER_SITE_OTHER)
Admission: EM | Admit: 2015-01-15 | Discharge: 2015-01-15 | Disposition: A | Discharge status: Home / Self Care | Payer: Medicare Other | Source: Home / Self Care | Attending: Emergency Medicine | Admitting: Emergency Medicine

## 2015-01-15 DIAGNOSIS — Z4802 Encounter for removal of sutures: Secondary | ICD-10-CM | POA: Diagnosis not present

## 2015-01-15 NOTE — ED Notes (Signed)
Suture removal of stitches.  Site left middle finger tip
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
## 2015-01-15 NOTE — Discharge Instructions (Signed)

## 2015-01-31 ENCOUNTER — Other Ambulatory Visit: Payer: Self-pay

## 2015-01-31 MED ORDER — AMLODIPINE BESYLATE 5 MG PO TABS: 5.0000 mg | ORAL_TABLET | Freq: Every day | ORAL | Status: DC

## 2015-02-21 DIAGNOSIS — D1801 Hemangioma of skin and subcutaneous tissue: Secondary | ICD-10-CM | POA: Diagnosis not present

## 2015-02-21 DIAGNOSIS — L821 Other seborrheic keratosis: Secondary | ICD-10-CM | POA: Diagnosis not present

## 2015-02-21 DIAGNOSIS — D2261 Melanocytic nevi of right upper limb, including shoulder: Secondary | ICD-10-CM | POA: Diagnosis not present

## 2015-02-21 DIAGNOSIS — L812 Freckles: Secondary | ICD-10-CM | POA: Diagnosis not present

## 2015-06-29 ENCOUNTER — Encounter: Payer: Self-pay | Admitting: Internal Medicine

## 2015-06-30 ENCOUNTER — Encounter: Payer: Self-pay | Admitting: Internal Medicine

## 2015-06-30 ENCOUNTER — Encounter: Payer: Self-pay | Admitting: Gastroenterology

## 2015-07-07 ENCOUNTER — Encounter: Payer: Self-pay | Admitting: Internal Medicine

## 2015-07-14 ENCOUNTER — Encounter: Payer: Self-pay | Admitting: *Deleted

## 2015-07-26 ENCOUNTER — Other Ambulatory Visit: Payer: Self-pay | Admitting: Internal Medicine

## 2015-09-07 ENCOUNTER — Telehealth: Payer: Self-pay | Admitting: Internal Medicine

## 2015-09-07 NOTE — Telephone Encounter (Signed)
Unfortunately, I'm not able to accept any more new patients at this time.  I'm sorry! Thank you!  

## 2015-09-07 NOTE — Telephone Encounter (Signed)
Patient would like to transfer from [place] to [place].  States Dr. Harrington Challenger suggested he transfer to [place].  Please advise.

## 2015-09-09 NOTE — Telephone Encounter (Signed)
Left patient voice mail with MD response

## 2015-09-19 DIAGNOSIS — B372 Candidiasis of skin and nail: Secondary | ICD-10-CM | POA: Diagnosis not present

## 2015-10-23 ENCOUNTER — Other Ambulatory Visit: Payer: Self-pay | Admitting: Internal Medicine

## 2015-11-11 ENCOUNTER — Telehealth: Payer: Self-pay | Admitting: Internal Medicine

## 2015-11-11 ENCOUNTER — Ambulatory Visit (INDEPENDENT_AMBULATORY_CARE_PROVIDER_SITE_OTHER): Payer: Medicare Other | Admitting: Internal Medicine

## 2015-11-11 ENCOUNTER — Encounter: Payer: Self-pay | Admitting: Internal Medicine

## 2015-11-11 VITALS — BP 122/76 | HR 64 | Ht 72.0 in | Wt 218.2 lb

## 2015-11-11 DIAGNOSIS — R0602 Shortness of breath: Secondary | ICD-10-CM | POA: Diagnosis not present

## 2015-11-11 DIAGNOSIS — E785 Hyperlipidemia, unspecified: Secondary | ICD-10-CM | POA: Diagnosis not present

## 2015-11-11 LAB — LIPID PANEL
Cholesterol: 167 mg/dL (ref 125–200)
HDL: 63 mg/dL (ref >=40)
LDL Cholesterol: 81 mg/dL (ref <130)
Total CHOL/HDL Ratio: 2.7 Ratio (ref <=5.0)
Triglycerides: 117 mg/dL (ref <150)
VLDL: 23 mg/dL (ref <30)

## 2015-11-11 LAB — CBC
HCT: 45.8 % (ref 38.5–50.0)
Hemoglobin: 15.4 g/dL (ref 13.2–17.1)
MCH: 29.4 pg (ref 27.0–33.0)
MCHC: 33.6 g/dL (ref 32.0–36.0)
MCV: 87.6 fL (ref 80.0–100.0)
MPV: 10.2 fL (ref 7.5–12.5)
Platelets: 208 10*3/uL (ref 140–400)
RBC: 5.23 MIL/uL (ref 4.20–5.80)
RDW: 14.8 % (ref 11.0–15.0)
WBC: 5.6 10*3/uL (ref 3.8–10.8)
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
## 2015-11-11 NOTE — Telephone Encounter (Signed)
New message   Pt is calling to speak to RN  Pt verbalized that he is calling for her to schedule a Stress Test

## 2015-11-11 NOTE — Patient Instructions (Addendum)
Your physician recommends that you continue on your current medications as directed. Please refer to the Current Medication list given to you today.  Your physician recommends that you return for lab work in: today (CBC, [place])  Your physician has requested that you have a Capac. For further information please visit [email or web address]. Please follow instruction sheet, as given.   Please call the office when you are ready to schedule.  We can set it up at that time.   Your physician wants you to follow-up in: 1 year with Dr. Harrington Challenger.  You will receive a reminder letter in the mail two months in advance. If you don't receive a letter, please call our office to schedule the follow-up appointment.

## 2015-11-11 NOTE — Telephone Encounter (Signed)
Stress test ordered. Called patient back to inform that scheduler will be calling him to schedule.  Reviewed instructions with him over the phone.  He verbalizes understanding and no questions at this time.

## 2015-11-18 DIAGNOSIS — I10 Essential (primary) hypertension: Secondary | ICD-10-CM | POA: Diagnosis not present

## 2015-11-18 DIAGNOSIS — Z6829 Body mass index (BMI) 29.0-29.9, adult: Secondary | ICD-10-CM | POA: Diagnosis not present

## 2015-11-18 DIAGNOSIS — E784 Other hyperlipidemia: Secondary | ICD-10-CM | POA: Diagnosis not present

## 2015-11-18 DIAGNOSIS — I2510 Atherosclerotic heart disease of native coronary artery without angina pectoris: Secondary | ICD-10-CM | POA: Diagnosis not present

## 2015-11-18 DIAGNOSIS — Z1389 Encounter for screening for other disorder: Secondary | ICD-10-CM | POA: Diagnosis not present

## 2015-11-18 DIAGNOSIS — K635 Polyp of colon: Secondary | ICD-10-CM | POA: Diagnosis not present

## 2015-11-18 DIAGNOSIS — R399 Unspecified symptoms and signs involving the genitourinary system: Secondary | ICD-10-CM | POA: Diagnosis not present

## 2015-11-18 DIAGNOSIS — M545 Low back pain: Secondary | ICD-10-CM | POA: Diagnosis not present

## 2015-11-21 ENCOUNTER — Telehealth: Payer: Self-pay | Admitting: Internal Medicine

## 2015-11-21 NOTE — Telephone Encounter (Signed)
11-21-15 Lvm to call and schedule myoview/saf

## 2015-11-24 DIAGNOSIS — L820 Inflamed seborrheic keratosis: Secondary | ICD-10-CM | POA: Diagnosis not present

## 2015-11-24 DIAGNOSIS — B372 Candidiasis of skin and nail: Secondary | ICD-10-CM | POA: Diagnosis not present

## 2015-11-25 ENCOUNTER — Telehealth (HOSPITAL_COMMUNITY): Payer: Self-pay | Admitting: Radiology

## 2015-11-25 NOTE — Telephone Encounter (Signed)
Patient given detailed instructions per Myocardial Perfusion Study Information Sheet for the test on 11/29/2015 at 7:45. Patient notified to arrive 15 minutes early and that it is imperative to arrive on time for appointment to keep from having the test rescheduled.  If you need to cancel or reschedule your appointment, please call the office within 24 hours of your appointment. Failure to do so may result in a cancellation of your appointment, and a $50 no show fee. Patient verbalized understanding.EAY

## 2015-11-29 ENCOUNTER — Encounter (HOSPITAL_COMMUNITY): Payer: Self-pay

## 2015-11-29 ENCOUNTER — Ambulatory Visit (HOSPITAL_COMMUNITY): Payer: Medicare Other | Attending: Cardiology

## 2015-11-29 DIAGNOSIS — R0609 Other forms of dyspnea: Secondary | ICD-10-CM | POA: Insufficient documentation

## 2015-11-29 DIAGNOSIS — R9439 Abnormal result of other cardiovascular function study: Secondary | ICD-10-CM | POA: Insufficient documentation

## 2015-11-29 DIAGNOSIS — R0602 Shortness of breath: Secondary | ICD-10-CM | POA: Diagnosis not present

## 2015-11-29 LAB — MYOCARDIAL PERFUSION IMAGING
LV dias vol: 104 mL (ref 62–150)
LV sys vol: 52 mL
Peak HR: 83 {beats}/min
RATE: 0.19
Rest HR: 64 {beats}/min
SDS: 4
SRS: 7
SSS: 11
TID: 1.01

## 2015-11-29 MED ORDER — TECHNETIUM TC 99M TETROFOSMIN IV KIT
32.4000 | PACK | Freq: Once | INTRAVENOUS | Status: AC | PRN
  Administered 2015-11-29: 32 via INTRAVENOUS
  Filled 2015-11-29: qty 32

## 2015-11-29 MED ORDER — TECHNETIUM TC 99M TETROFOSMIN IV KIT
10.5000 | PACK | Freq: Once | INTRAVENOUS | Status: AC | PRN
  Administered 2015-11-29: 11 via INTRAVENOUS
  Filled 2015-11-29: qty 11

## 2015-11-29 MED ORDER — REGADENOSON 0.4 MG/5ML IV SOLN
0.4000 mg | Freq: Once | INTRAVENOUS | Status: AC
  Administered 2015-11-29: 0.4 mg via INTRAVENOUS

## 2015-12-05 ENCOUNTER — Other Ambulatory Visit: Payer: Self-pay | Admitting: Internal Medicine

## 2015-12-05 ENCOUNTER — Other Ambulatory Visit: Payer: Self-pay | Admitting: *Deleted

## 2015-12-05 DIAGNOSIS — I059 Rheumatic mitral valve disease, unspecified: Secondary | ICD-10-CM

## 2015-12-06 ENCOUNTER — Telehealth: Payer: Self-pay | Admitting: Internal Medicine

## 2015-12-06 NOTE — Telephone Encounter (Signed)
°*  STAT* If patient is at the pharmacy, call can be transferred to refill team.   1. Which medications need to be refilled? (please list name of each medication and dose if known) Atorvastatin 40mg   2. Which pharmacy/location (including street and city if local pharmacy) is medication to be sent to?[facility]   3. Do they need a 30 day or 90 day supply? [place]

## 2015-12-06 NOTE — Telephone Encounter (Signed)
Approved      Disp Refills Start End    LIPITOR 40 MG tablet 90 tablet 3 12/06/2015     Sig - Route:  Take 1 tablet (40 mg total) by mouth daily. - Oral    Class:  Normal    DAW:  Yes    Authorizing Provider:  Fay Records, MD    Ordering User:  Juventino Slovak, [place]     [facility] [place] [place]     Contacts       Type Contact Phone    12/05/2015 03:34 PM Interface (Incoming) [place] [phone]      Routing History     Priority Sent On From To Message Type     12/05/2015 3:34 PM [place] Refill Rx Request

## 2015-12-27 ENCOUNTER — Telehealth: Payer: Self-pay | Admitting: Internal Medicine

## 2015-12-27 NOTE — Telephone Encounter (Signed)
Pt returning call to nurse to schedule procedure-pls call

## 2015-12-27 NOTE — Telephone Encounter (Signed)
Patient was calling to schedule an echo. Asked [facility] to contact patient. Appointment scheduled for echo for 7/11.

## 2016-01-19 ENCOUNTER — Other Ambulatory Visit: Payer: Self-pay | Admitting: Internal Medicine

## 2016-01-24 ENCOUNTER — Ambulatory Visit (HOSPITAL_COMMUNITY): Payer: Medicare Other | Attending: Cardiology

## 2016-01-24 ENCOUNTER — Other Ambulatory Visit: Payer: Self-pay

## 2016-01-24 DIAGNOSIS — I77810 Thoracic aortic ectasia: Secondary | ICD-10-CM | POA: Insufficient documentation

## 2016-01-24 DIAGNOSIS — I059 Rheumatic mitral valve disease, unspecified: Secondary | ICD-10-CM | POA: Diagnosis not present

## 2016-01-24 DIAGNOSIS — I340 Nonrheumatic mitral (valve) insufficiency: Secondary | ICD-10-CM | POA: Diagnosis not present

## 2016-01-24 DIAGNOSIS — Z87891 Personal history of nicotine dependence: Secondary | ICD-10-CM | POA: Insufficient documentation

## 2016-01-24 DIAGNOSIS — I351 Nonrheumatic aortic (valve) insufficiency: Secondary | ICD-10-CM | POA: Diagnosis not present

## 2016-01-24 DIAGNOSIS — I517 Cardiomegaly: Secondary | ICD-10-CM | POA: Insufficient documentation

## 2016-01-26 ENCOUNTER — Other Ambulatory Visit: Payer: Self-pay | Admitting: *Deleted

## 2016-01-26 MED ORDER — FUROSEMIDE 20 MG PO TABS: ORAL_TABLET | ORAL | Status: DC

## 2016-01-31 ENCOUNTER — Telehealth: Payer: Self-pay | Admitting: *Deleted

## 2016-01-31 ENCOUNTER — Other Ambulatory Visit: Payer: Self-pay | Admitting: *Deleted

## 2016-01-31 MED ORDER — FUROSEMIDE 20 MG PO TABS: 20.0000 mg | ORAL_TABLET | Freq: Every day | ORAL | Status: DC | PRN

## 2016-01-31 NOTE — Telephone Encounter (Signed)
Pharmacist from [facility] called to clarify the sig on the rx for furosemide that was sent in on 01/26/16. Please advise. Thanks, MI

## 2016-01-31 NOTE — Telephone Encounter (Signed)
Spoke with patient and he is going to take daily as needed for SOB

## 2016-01-31 NOTE — Telephone Encounter (Signed)
Will resend rx with sig as below.

## 2016-05-23 DIAGNOSIS — N390 Urinary tract infection, site not specified: Secondary | ICD-10-CM | POA: Diagnosis not present

## 2016-05-23 DIAGNOSIS — R8299 Other abnormal findings in urine: Secondary | ICD-10-CM | POA: Diagnosis not present

## 2016-05-23 DIAGNOSIS — E784 Other hyperlipidemia: Secondary | ICD-10-CM | POA: Diagnosis not present

## 2016-05-23 DIAGNOSIS — I10 Essential (primary) hypertension: Secondary | ICD-10-CM | POA: Diagnosis not present

## 2016-05-23 DIAGNOSIS — Z125 Encounter for screening for malignant neoplasm of prostate: Secondary | ICD-10-CM | POA: Diagnosis not present

## 2016-05-29 DIAGNOSIS — I10 Essential (primary) hypertension: Secondary | ICD-10-CM | POA: Diagnosis not present

## 2016-05-29 DIAGNOSIS — K635 Polyp of colon: Secondary | ICD-10-CM | POA: Diagnosis not present

## 2016-05-29 DIAGNOSIS — I341 Nonrheumatic mitral (valve) prolapse: Secondary | ICD-10-CM | POA: Diagnosis not present

## 2016-05-29 DIAGNOSIS — Z0000 Encounter for general adult medical examination without abnormal findings: Secondary | ICD-10-CM | POA: Diagnosis not present

## 2016-05-29 DIAGNOSIS — I2510 Atherosclerotic heart disease of native coronary artery without angina pectoris: Secondary | ICD-10-CM | POA: Diagnosis not present

## 2016-05-29 DIAGNOSIS — E663 Overweight: Secondary | ICD-10-CM | POA: Diagnosis not present

## 2016-05-29 DIAGNOSIS — I119 Hypertensive heart disease without heart failure: Secondary | ICD-10-CM | POA: Insufficient documentation

## 2016-05-29 DIAGNOSIS — Z1389 Encounter for screening for other disorder: Secondary | ICD-10-CM | POA: Diagnosis not present

## 2016-05-29 DIAGNOSIS — Z6829 Body mass index (BMI) 29.0-29.9, adult: Secondary | ICD-10-CM | POA: Diagnosis not present

## 2016-05-29 DIAGNOSIS — M545 Low back pain: Secondary | ICD-10-CM | POA: Diagnosis not present

## 2016-05-29 DIAGNOSIS — E784 Other hyperlipidemia: Secondary | ICD-10-CM | POA: Diagnosis not present

## 2016-06-26 DIAGNOSIS — Z1212 Encounter for screening for malignant neoplasm of rectum: Secondary | ICD-10-CM | POA: Diagnosis not present

## 2016-06-26 LAB — IFOBT (OCCULT BLOOD): IFOBT: POSITIVE

## 2016-06-29 ENCOUNTER — Encounter: Payer: Self-pay | Admitting: Internal Medicine

## 2016-07-02 ENCOUNTER — Telehealth: Payer: Self-pay | Admitting: *Deleted

## 2016-07-02 NOTE — Telephone Encounter (Signed)
-----   Message from Jeremy Bears, MD sent at 07/02/2016  4:06 PM EST ----- Regarding: Appt Contacted by Dr. Sharlett Iles re: Jeremy Klein Former Sharlett Iles patient. Pt feels like he needs to be sooner than late Feb Wants to see me Jeremy Klein, please see what he is worried about.  This can help triage when to work him in.  Early Jan will likely be okay, unless symptoms warrant an even earlier visit. Thanks [employer]

## 2016-07-02 NOTE — Telephone Encounter (Signed)
We have an opening for 07/04/16. Patient has scheduled to see Dr Hilarie Fredrickson on 07/04/16 at 11 am.

## 2016-07-04 ENCOUNTER — Ambulatory Visit (INDEPENDENT_AMBULATORY_CARE_PROVIDER_SITE_OTHER): Payer: Medicare Other | Admitting: Internal Medicine

## 2016-07-04 ENCOUNTER — Encounter: Payer: Self-pay | Admitting: *Deleted

## 2016-07-04 VITALS — BP 124/78 | HR 88 | Ht 72.0 in | Wt 221.4 lb

## 2016-07-04 DIAGNOSIS — Z86010 Personal history of colonic polyps: Secondary | ICD-10-CM | POA: Diagnosis not present

## 2016-07-04 DIAGNOSIS — R195 Other fecal abnormalities: Secondary | ICD-10-CM | POA: Diagnosis not present

## 2016-07-04 MED ORDER — NA SULFATE-K SULFATE-MG SULF 17.5-3.13-1.6 GM/177ML PO SOLN: ORAL | 0 refills | Status: DC

## 2016-07-04 NOTE — Patient Instructions (Signed)
You have been scheduled for a colonoscopy. Please follow written instructions given to you at your visit today.  Please pick up your prep supplies at the pharmacy within the next 1-3 days. If you use inhalers (even only as needed), please bring them with you on the day of your procedure. Your physician has requested that you go to www.startemmi.com and enter the access code given to you at your visit today. This web site gives a general overview about your procedure. However, you should still follow specific instructions given to you by our office regarding your preparation for the procedure.  If you are age 29 or older, your body mass index should be between 23-30. Your Body mass index is 30.03 kg/m. If this is out of the aforementioned range listed, please consider follow up with your Primary Care Provider.  If you are age 65 or younger, your body mass index should be between 19-25. Your Body mass index is 30.03 kg/m. If this is out of the aformentioned range listed, please consider follow up with your Primary Care Provider.
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
## 2016-07-06 ENCOUNTER — Telehealth: Payer: Self-pay | Admitting: Internal Medicine

## 2016-07-06 MED ORDER — NA SULFATE-K SULFATE-MG SULF 17.5-3.13-1.6 GM/177ML PO SOLN: ORAL | 0 refills | Status: DC

## 2016-07-06 NOTE — Telephone Encounter (Signed)
Rx sent 

## 2016-07-18 ENCOUNTER — Encounter: Payer: Self-pay | Admitting: Internal Medicine

## 2016-07-18 ENCOUNTER — Ambulatory Visit (AMBULATORY_SURGERY_CENTER): Payer: Medicare Other | Admitting: Internal Medicine

## 2016-07-18 VITALS — BP 141/84 | HR 63 | Temp 97.5°F | Resp 16 | Ht 72.0 in | Wt 221.0 lb

## 2016-07-18 DIAGNOSIS — D122 Benign neoplasm of ascending colon: Secondary | ICD-10-CM

## 2016-07-18 DIAGNOSIS — R195 Other fecal abnormalities: Secondary | ICD-10-CM

## 2016-07-18 DIAGNOSIS — D123 Benign neoplasm of transverse colon: Secondary | ICD-10-CM | POA: Diagnosis not present

## 2016-07-18 DIAGNOSIS — D120 Benign neoplasm of cecum: Secondary | ICD-10-CM | POA: Diagnosis not present

## 2016-07-18 DIAGNOSIS — D124 Benign neoplasm of descending colon: Secondary | ICD-10-CM

## 2016-07-18 DIAGNOSIS — D129 Benign neoplasm of anus and anal canal: Secondary | ICD-10-CM

## 2016-07-18 DIAGNOSIS — D128 Benign neoplasm of rectum: Secondary | ICD-10-CM | POA: Diagnosis not present

## 2016-07-18 DIAGNOSIS — Z86010 Personal history of colonic polyps: Secondary | ICD-10-CM | POA: Diagnosis not present

## 2016-07-18 DIAGNOSIS — I10 Essential (primary) hypertension: Secondary | ICD-10-CM | POA: Diagnosis not present

## 2016-07-18 DIAGNOSIS — K635 Polyp of colon: Secondary | ICD-10-CM

## 2016-07-18 DIAGNOSIS — D125 Benign neoplasm of sigmoid colon: Secondary | ICD-10-CM

## 2016-07-18 HISTORY — PX: COLONOSCOPY: SHX174

## 2016-07-18 MED ORDER — SODIUM CHLORIDE 0.9 % IV SOLN: 500.0000 mL | INTRAVENOUS | Status: DC

## 2016-07-18 NOTE — Progress Notes (Signed)
Report to PACU, RN, vss, BBS= Clear.  

## 2016-07-18 NOTE — Progress Notes (Signed)
Called to room to assist during endoscopic procedure.  Patient ID and intended procedure confirmed with present staff. Received instructions for my participation in the procedure from the performing physician.  

## 2016-07-18 NOTE — Patient Instructions (Signed)
YOU HAD AN ENDOSCOPIC PROCEDURE TODAY AT THE [facility]:   Refer to the procedure report that was given to you for any specific questions about what was found during the examination.  If the procedure report does not answer your questions, please call your gastroenterologist to clarify.  If you requested that your care partner not be given the details of your procedure findings, then the procedure report has been included in a sealed envelope for you to review at your convenience later.  YOU SHOULD EXPECT: Some feelings of bloating in the abdomen. Passage of more gas than usual.  Walking can help get rid of the air that was put into your GI tract during the procedure and reduce the bloating. If you had a lower endoscopy (such as a colonoscopy or flexible sigmoidoscopy) you may notice spotting of blood in your stool or on the toilet paper. If you underwent a bowel prep for your procedure, you may not have a normal bowel movement for a few days.  Please Note:  You might notice some irritation and congestion in your nose or some drainage.  This is from the oxygen used during your procedure.  There is no need for concern and it should clear up in a day or so.  SYMPTOMS TO REPORT IMMEDIATELY:   Following lower endoscopy (colonoscopy or flexible sigmoidoscopy):  Excessive amounts of blood in the stool  Significant tenderness or worsening of abdominal pains  Swelling of the abdomen that is new, acute  Fever of 100F or higher   Following upper endoscopy (EGD)  Vomiting of blood or coffee ground material  New chest pain or pain under the shoulder blades  Painful or persistently difficult swallowing  New shortness of breath  Fever of 100F or higher  Black, tarry-looking stools  For urgent or emergent issues, a gastroenterologist can be reached at any hour by calling [phone].   DIET:  We do recommend a small meal at first, but then you may proceed to your regular diet.  Drink  plenty of fluids but you should avoid alcoholic beverages for 24 hours.  ACTIVITY:  You should plan to take it easy for the rest of today and you should NOT DRIVE or use heavy machinery until tomorrow (because of the sedation medicines used during the test).    FOLLOW UP: Our staff will call the number listed on your records the next business day following your procedure to check on you and address any questions or concerns that you may have regarding the information given to you following your procedure. If we do not reach you, we will leave a message.  However, if you are feeling well and you are not experiencing any problems, there is no need to return our call.  We will assume that you have returned to your regular daily activities without incident.  If any biopsies were taken you will be contacted by phone or by letter within the next 1-3 weeks.  Please call us at [phone] if you have not heard about the biopsies in 3 weeks.    SIGNATURES/CONFIDENTIALITY: You and/or your care partner have signed paperwork which will be entered into your electronic medical record.  These signatures attest to the fact that that the information above on your After Visit Summary has been reviewed and is understood.  Full responsibility of the confidentiality of this discharge information lies with you and/or your care-partner.  Polyp and diverticulosis information given. 

## 2016-07-18 NOTE — Op Note (Signed)
[facility] Patient Name: Jeremy Klein Procedure Date: 07/18/2016 8:24 AM MRN: [id number] Endoscopist: Jerene Bears , MD Age: 76 Referring MD:  Date of Birth: 11-06-1940 Gender: Male Account #: [id number] Procedure:                Colonoscopy Indications:              Last colonoscopy: March 2014, Positive fecal                            immunochemical test Medicines:                Monitored Anesthesia Care Procedure:                Pre-Anesthesia Assessment:                           - Prior to the procedure, a History and Physical                            was performed, and patient medications and                            allergies were reviewed. The patient's tolerance of                            previous anesthesia was also reviewed. The risks                            and benefits of the procedure and the sedation                            options and risks were discussed with the patient.                            All questions were answered, and informed consent                            was obtained. Prior Anticoagulants: The patient has                            taken no previous anticoagulant or antiplatelet                            agents. ASA Grade Assessment: II - A patient with                            mild systemic disease. After reviewing the risks                            and benefits, the patient was deemed in                            satisfactory condition to undergo the procedure.  After obtaining informed consent, the colonoscope                            was passed under direct vision. Throughout the                            procedure, the patient's blood pressure, pulse, and                            oxygen saturations were monitored continuously. The                            Model CF-HQ190L [id number]) scope was introduced                            through the anus and advanced to the the cecum,                             identified by appendiceal orifice and ileocecal                            valve. The colonoscopy was performed without                            difficulty. The patient tolerated the procedure                            well. The quality of the bowel preparation was                            good. The ileocecal valve, appendiceal orifice, and                            rectum were photographed. Scope In: 8:36:36 AM Scope Out: [id number] AM Scope Withdrawal Time: 0 hours 19 minutes 8 seconds  Total Procedure Duration: 0 hours 22 minutes 42 seconds  Findings:                 The digital rectal exam was normal.                           A 5 mm polyp was found in the ileocecal valve. The                            polyp was sessile. The polyp was removed with a                            cold snare. Resection and retrieval were complete.                           A 3 mm polyp was found in the ascending colon. The                            polyp was sessile.  The polyp was removed with a                            cold snare. Resection and retrieval were complete.                           A 5 mm polyp was found in the hepatic flexure. The                            polyp was sessile. The polyp was removed with a                            cold snare. Resection and retrieval were complete.                           A 5 mm polyp was found in the transverse colon. The                            polyp was sessile. The polyp was removed with a                            cold snare. Resection and retrieval were complete.                           A 5 mm polyp was found in the descending colon. The                            polyp was sessile. The polyp was removed with a                            cold snare. Resection and retrieval were complete.                           A 6 mm polyp was found in the sigmoid colon. The                            polyp was sessile. The  polyp was removed with a                            cold snare. Resection and retrieval were complete.                           A 5 mm polyp was found in the rectum. The polyp was                            sessile. The polyp was removed with a cold snare.                            Resection and retrieval were complete.  Multiple small and large-mouthed diverticula were                            found in the sigmoid colon and descending colon.                           Internal hemorrhoids were found during                            retroflexion. The hemorrhoids were medium-sized. Complications:            No immediate complications. Estimated Blood Loss:     Estimated blood loss was minimal. Impression:               - One 5 mm polyp at the ileocecal valve, removed                            with a cold snare. Resected and retrieved.                           - One 3 mm polyp in the ascending colon, removed                            with a cold snare. Resected and retrieved.                           - One 5 mm polyp at the hepatic flexure, removed                            with a cold snare. Resected and retrieved.                           - One 5 mm polyp in the transverse colon, removed                            with a cold snare. Resected and retrieved.                           - One 5 mm polyp in the descending colon, removed                            with a cold snare. Resected and retrieved.                           - One 6 mm polyp in the sigmoid colon, removed with                            a cold snare. Resected and retrieved.                           - One 5 mm polyp in the rectum, removed with a cold  snare. Resected and retrieved.                           - Mild diverticulosis in the sigmoid colon and in                            the descending colon.                           - Internal  hemorrhoids. Recommendation:           - Patient has a contact number available for                            emergencies. The signs and symptoms of potential                            delayed complications were discussed with the                            patient. Return to normal activities tomorrow.                            Written discharge instructions were provided to the                            patient.                           - Resume previous diet.                           - Continue present medications.                           - Await pathology results.                           - Repeat colonoscopy is recommended for                            surveillance. The colonoscopy date will be                            determined after pathology results from today's                            exam become available for review. Jerene Bears, MD 07/18/2016 9:05:09 AM This report has been signed electronically.

## 2016-07-19 ENCOUNTER — Telehealth: Payer: Self-pay

## 2016-07-19 NOTE — Telephone Encounter (Signed)
  Follow up Call-  Call back number 07/18/2016  Post procedure Call Back phone  # [phone]  Permission to leave phone message Yes  Some recent data might be hidden     Patient questions:   Do you have a fever, pain , or abdominal swelling? No. Pain Score  0 *  Have you tolerated food without any problems? Yes.    Have you been able to return to your normal activities? Yes.    Do you have any questions about your discharge instructions: Diet   No. Medications  No. Follow up visit  No.  Do you have questions or concerns about your Care? No.  Actions: * If pain score is 4 or above: No action needed, pain <4.

## 2016-07-25 ENCOUNTER — Encounter: Payer: Self-pay | Admitting: Internal Medicine

## 2016-09-04 ENCOUNTER — Other Ambulatory Visit: Payer: Self-pay | Admitting: *Deleted

## 2016-09-04 ENCOUNTER — Encounter: Payer: Self-pay | Admitting: *Deleted

## 2016-09-04 MED ORDER — ATORVASTATIN CALCIUM 40 MG PO TABS: 40.0000 mg | ORAL_TABLET | Freq: Every day | ORAL | 3 refills | Status: DC

## 2016-09-04 NOTE — Progress Notes (Signed)
Refilled atorvastatin with note to pharmacy to provide generic

## 2016-09-04 NOTE — Progress Notes (Signed)
Refilled atorvastatin per note from Dr. Harrington Challenger.

## 2016-09-07 ENCOUNTER — Ambulatory Visit: Payer: Medicare Other | Admitting: Internal Medicine

## 2016-10-10 DIAGNOSIS — H5213 Myopia, bilateral: Secondary | ICD-10-CM | POA: Diagnosis not present

## 2016-10-10 DIAGNOSIS — H524 Presbyopia: Secondary | ICD-10-CM | POA: Diagnosis not present

## 2016-10-10 DIAGNOSIS — H01003 Unspecified blepharitis right eye, unspecified eyelid: Secondary | ICD-10-CM | POA: Diagnosis not present

## 2016-10-10 DIAGNOSIS — H52203 Unspecified astigmatism, bilateral: Secondary | ICD-10-CM | POA: Diagnosis not present

## 2016-10-10 DIAGNOSIS — H01006 Unspecified blepharitis left eye, unspecified eyelid: Secondary | ICD-10-CM | POA: Diagnosis not present

## 2016-10-10 DIAGNOSIS — H0014 Chalazion left upper eyelid: Secondary | ICD-10-CM | POA: Diagnosis not present

## 2016-10-24 ENCOUNTER — Encounter: Payer: Self-pay | Admitting: Internal Medicine

## 2016-10-30 DIAGNOSIS — I10 Essential (primary) hypertension: Secondary | ICD-10-CM | POA: Diagnosis not present

## 2016-10-30 DIAGNOSIS — K921 Melena: Secondary | ICD-10-CM | POA: Insufficient documentation

## 2016-11-01 ENCOUNTER — Ambulatory Visit (INDEPENDENT_AMBULATORY_CARE_PROVIDER_SITE_OTHER): Payer: Medicare Other | Admitting: Physician Assistant

## 2016-11-01 ENCOUNTER — Other Ambulatory Visit (INDEPENDENT_AMBULATORY_CARE_PROVIDER_SITE_OTHER): Payer: Medicare Other

## 2016-11-01 ENCOUNTER — Encounter: Payer: Self-pay | Admitting: Physician Assistant

## 2016-11-01 VITALS — BP 118/68 | HR 77 | Resp 18 | Ht 72.0 in | Wt 217.0 lb

## 2016-11-01 DIAGNOSIS — H0014 Chalazion left upper eyelid: Secondary | ICD-10-CM | POA: Diagnosis not present

## 2016-11-01 DIAGNOSIS — R195 Other fecal abnormalities: Secondary | ICD-10-CM

## 2016-11-01 DIAGNOSIS — K279 Peptic ulcer, site unspecified, unspecified as acute or chronic, without hemorrhage or perforation: Secondary | ICD-10-CM | POA: Diagnosis not present

## 2016-11-01 DIAGNOSIS — I2510 Atherosclerotic heart disease of native coronary artery without angina pectoris: Secondary | ICD-10-CM | POA: Diagnosis not present

## 2016-11-01 DIAGNOSIS — H1012 Acute atopic conjunctivitis, left eye: Secondary | ICD-10-CM | POA: Diagnosis not present

## 2016-11-01 DIAGNOSIS — H353132 Nonexudative age-related macular degeneration, bilateral, intermediate dry stage: Secondary | ICD-10-CM | POA: Diagnosis not present

## 2016-11-01 DIAGNOSIS — H04123 Dry eye syndrome of bilateral lacrimal glands: Secondary | ICD-10-CM | POA: Diagnosis not present

## 2016-11-01 LAB — CBC WITH DIFFERENTIAL/PLATELET
Basophils Absolute: 0 10*3/uL (ref 0.0–0.1)
Basophils Relative: 0.5 % (ref 0.0–3.0)
Eosinophils Absolute: 0.1 10*3/uL (ref 0.0–0.7)
Eosinophils Relative: 2.5 % (ref 0.0–5.0)
HCT: 36.3 % — ABNORMAL LOW (ref 39.0–52.0)
Hemoglobin: 12.2 g/dL — ABNORMAL LOW (ref 13.0–17.0)
Lymphocytes Relative: 24.9 % (ref 12.0–46.0)
Lymphs Abs: 1.4 10*3/uL (ref 0.7–4.0)
MCHC: 33.7 g/dL (ref 30.0–36.0)
MCV: 90.8 fl (ref 78.0–100.0)
Monocytes Absolute: 0.5 10*3/uL (ref 0.1–1.0)
Monocytes Relative: 9.6 % (ref 3.0–12.0)
Neutro Abs: 3.5 10*3/uL (ref 1.4–7.7)
Neutrophils Relative %: 62.5 % (ref 43.0–77.0)
Platelets: 217 10*3/uL (ref 150.0–400.0)
RBC: 4 Mil/uL — ABNORMAL LOW (ref 4.22–5.81)
RDW: 15 % (ref 11.5–15.5)
WBC: 5.6 10*3/uL (ref 4.0–10.5)

## 2016-11-01 MED ORDER — PANTOPRAZOLE SODIUM 40 MG PO TBEC: 40.0000 mg | DELAYED_RELEASE_TABLET | Freq: Two times a day (BID) | ORAL | 4 refills | Status: DC

## 2016-11-01 NOTE — Progress Notes (Signed)
   Subjective:    Patient ID: Jeremy Klein, male    DOB: [birth year], 76 y.o.   MRN: [id number]  HPI   Review of Systems        Objective:     Physical Exam       Assessment & Plan:     Alfredia Ferguson PA-C 11/01/2016   Cc: Shon Baton, MD

## 2016-11-01 NOTE — Patient Instructions (Signed)
You have been scheduled for an endoscopy. Please follow written instructions given to you at your visit today. If you use inhalers (even only as needed), please bring them with you on the day of your procedure. Your physician has requested that you go to www.startemmi.com and enter the access code given to you at your visit today. This web site gives a general overview about your procedure. However, you should still follow specific instructions given to you by our office regarding your preparation for the procedure.  Your physician has requested that you go to the basement for lab work before leaving today.  Stop your aspirin.   Start Protonix 40 mg take twice daily for 2 weeks then once daily.

## 2016-11-01 NOTE — Progress Notes (Signed)
Agree with assessment and plan. Sounds like slow GI bleed, stable. Added onto my schedule for EGD first case tomorrow morning to evaluate in Dr. Vena Rua absence. If symptoms of worsening bleeding in the interim he will need to go to the hospital. Agree with empiric PPI at this time.

## 2016-11-01 NOTE — Progress Notes (Signed)
Subjective:    Patient ID: Jeremy Klein, male    DOB: 05-05-1941, 76 y.o.   MRN: [id number]  HPI Devun is a very pleasant 76 year old white male known to Dr. Hilarie Fredrickson from previous colonoscopy who comes in today with complaints of dark stools over the past 6 days. His primary care physician is Dr. Virgina Jock, and he had hemoglobin drawn there on 10/30/2016 which was 13.5. He was asked to get an appointment with GI. His baseline hemoglobin is about 15. Patient says he had onset of current symptoms this past Saturday afternoon been out to eat a meal. He said he had some urge for bowel movement and then passed a forceful bowel movement which was very dark in color. He did not see any blood in the commode and has not seen any clots. He denies any abdominal pain has not had any nausea or cramping. Appetite has been okay. As no complaints of heartburn or indigestion. No prior history of any GI bleeding. He says he feels fine otherwise. He has been having about 1 bowel movement per day since onset of the symptoms. He does not take any regular NSAIDs but has been taking a 325 mg aspirin daily long-term. He is not on any PPI therapy. Patient has history of hypertension, previous mitral valve repair. He had colonoscopy in January 2018 with finding of 7 colon polyps all of which were tubular adenomas the largest was 6 mm and also had multiple diverticuli as well as internal hemorrhoids. He is to have a 3 year interval follow-up.  Review of Systems Pertinent positive and negative review of systems were noted in the above HPI section.  All other review of systems was otherwise negative.  Outpatient Encounter Prescriptions as of 11/01/2016  Medication Sig  . amLODipine (NORVASC) 5 MG tablet TAKE 1 TABLET BY MOUTH DAILY  . aspirin 325 MG tablet Take 325 mg by mouth daily.    Marland Kitchen atorvastatin (LIPITOR) 40 MG tablet Take 1 tablet (40 mg total) by mouth daily at 6 PM.  . Multiple Vitamins-Minerals (ICAPS PO) Take by  mouth. 2 by mouth in the am, 2 by mouth in the pm   . Saw Palmetto 500 MG CAPS Take 1 capsule by mouth daily.   . pantoprazole (PROTONIX) 40 MG tablet Take 1 tablet (40 mg total) by mouth 2 (two) times daily.   Facility-Administered Encounter Medications as of 11/01/2016  Medication  . 0.9 %  sodium chloride infusion   No Known Allergies Patient Active Problem List   Diagnosis Date Noted  . Syncope 04/17/2011  . CAD, NATIVE VESSEL 02/04/2009  . Mitral valve disorder 02/04/2009  . HYPERPLASIA PROSTATE UNS W/O UR OBST & OTH LUTS 12/22/2008  . HYPERTENSION, ESSENTIAL NOS 10/30/2007  . COLONIC POLYPS 07/03/2007  . Hyperlipemia 07/03/2007  . MACULAR DEGENERATION, EARLY 07/03/2007  . LOW BACK PAIN, CHRONIC 03/26/2007   Social History   Social History  . Marital status: Married    Spouse name: N/A  . Number of children: N/A  . Years of education: N/A   Occupational History  . federal judge [employer]   Social History Main Topics  . Smoking status: Former Smoker    Quit date: 07/16/1978  . Smokeless tobacco: Never Used     Comment: smoked 1958-1980, up to 1 ppd for 5 years  . Alcohol use 1.8 oz/week    3 Shots of liquor per week     Comment: socially  . Drug use: No  .  Sexual activity: Not on file   Other Topics Concern  . Not on file   Social History Narrative  . No narrative on file    Mr. Fanelli family history includes Glaucoma in his father; Heart attack (age of onset: 80) in his father; Hypertension (age of onset: 14) in his mother; Prostate cancer (age of onset: 59) in his father; Skin cancer in his father.      Objective:    Vitals:   11/01/16 0830  BP: 118/68  Pulse: 77  Resp: 18    Physical Exam  well-developed older white male in no acute distress, very pleasant blood pressure 118/68, pulse 77, height 6 foot, weight 217, BMI 29.4. HEENT; nontraumatic normocephalic EOMI PERRLA sclera anicteric, Cardiovascular; regular rate and rhythm with  S1-S2 no murmur rub or gallop, Pulmonary ;clear bilaterally, Abdomen; soft, nontender nondistended bowel sounds are active there is no palpable mass or hepatosplenomegaly, Rectal ;exam stool very dark and heme positive not grossly melenic. Extremities; no clubbing cyanosis or edema skin warm and dry, Neuropsych; mood and affect appropriate       Assessment & Plan:   #48 76 year old white male with onset of very dark stools about 6 days ago. He is not having overt melena. 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 Hilarie Fredrickson on Monday, 11/05/2016. Procedure discussed in detail with the patient including risks and benefits and he is agreeable to proceed.   Addendum; hemoglobin today returned at 12.2 down 1 g over the past couple of days.. Discussed with Dr. Hilarie Fredrickson. Patient very hemodynamically stable and do not feel that he needs to be hospitalized at this point. We also have discussed with Dr. Havery Moros who has availability early tomorrow morning for EGD. We will move his upper endoscopy to tomorrow a.m. with Dr. Havery Moros. Further plans and recommendations pending results of EGD.  Dayshia Ballinas Genia Harold PA-C 11/01/2016   Cc: Shon Baton, MD

## 2016-11-02 ENCOUNTER — Ambulatory Visit (AMBULATORY_SURGERY_CENTER): Payer: Medicare Other | Admitting: Gastroenterology

## 2016-11-02 ENCOUNTER — Encounter: Payer: Self-pay | Admitting: Gastroenterology

## 2016-11-02 ENCOUNTER — Other Ambulatory Visit: Payer: Self-pay | Admitting: Gastroenterology

## 2016-11-02 VITALS — BP 144/77 | HR 63 | Temp 97.5°F | Resp 17 | Ht 72.0 in | Wt 217.0 lb

## 2016-11-02 DIAGNOSIS — K317 Polyp of stomach and duodenum: Secondary | ICD-10-CM | POA: Diagnosis not present

## 2016-11-02 DIAGNOSIS — K3189 Other diseases of stomach and duodenum: Secondary | ICD-10-CM | POA: Diagnosis not present

## 2016-11-02 DIAGNOSIS — I10 Essential (primary) hypertension: Secondary | ICD-10-CM | POA: Diagnosis not present

## 2016-11-02 DIAGNOSIS — D649 Anemia, unspecified: Secondary | ICD-10-CM

## 2016-11-02 DIAGNOSIS — R195 Other fecal abnormalities: Secondary | ICD-10-CM | POA: Diagnosis not present

## 2016-11-02 DIAGNOSIS — I2510 Atherosclerotic heart disease of native coronary artery without angina pectoris: Secondary | ICD-10-CM | POA: Diagnosis not present

## 2016-11-02 MED ORDER — SODIUM CHLORIDE 0.9 % IV SOLN: 500.0000 mL | INTRAVENOUS | Status: DC

## 2016-11-02 NOTE — Progress Notes (Signed)
To recovery, report to [place], rn, VSS

## 2016-11-02 NOTE — Patient Instructions (Signed)
YOU HAD AN ENDOSCOPIC PROCEDURE TODAY AT [facility]:   Refer to the procedure report that was given to you for any specific questions about what was found during the examination.  If the procedure report does not answer your questions, please call your gastroenterologist to clarify.  If you requested that your care partner not be given the details of your procedure findings, then the procedure report has been included in a sealed envelope for you to review at your convenience later.  YOU SHOULD EXPECT: Some feelings of bloating in the abdomen. Passage of more gas than usual.  Walking can help get rid of the air that was put into your GI tract during the procedure and reduce the bloating. If you had a lower endoscopy (such as a colonoscopy or flexible sigmoidoscopy) you may notice spotting of blood in your stool or on the toilet paper. If you underwent a bowel prep for your procedure, you may not have a normal bowel movement for a few days.  Please Note:  You might notice some irritation and congestion in your nose or some drainage.  This is from the oxygen used during your procedure.  There is no need for concern and it should clear up in a day or so.  SYMPTOMS TO REPORT IMMEDIATELY:   FollowFollowing upper endoscopy (EGD)  Vomiting of blood or coffee ground material  New chest pain or pain under the shoulder blades  Painful or persistently difficult swallowing  New shortness of breath  Fever of 100F or higher  Black, tarry-looking stools  For urgent or emergent issues, a gastroenterologist can be reached at any hour by calling [phone].   DIET:  We do recommend a small meal at first, but then you may proceed to your regular diet.  Drink plenty of fluids but you should avoid alcoholic beverages for 24 hours.  ACTIVITY:  You should plan to take it easy for the rest of today and you should NOT DRIVE or use heavy machinery until tomorrow (because of the sedation medicines  used during the test).    FOLLOW UP: Our staff will call the number listed on your records the next business day following your procedure to check on you and address any questions or concerns that you may have regarding the information given to you following your procedure. If we do not reach you, we will leave a message.  However, if you are feeling well and you are not experiencing any problems, there is no need to return our call.  We will assume that you have returned to your regular daily activities without incident.  If any biopsies were taken you will be contacted by phone or by letter within the next 1-3 weeks.  Please call us at [phone] if you have not heard about the biopsies in 3 weeks.    SIGNATURES/CONFIDENTIALITY: You and/or your care partner have signed paperwork which will be entered into your electronic medical record.  These signatures attest to the fact that that the information above on your After Visit Summary has been reviewed and is understood.  Full responsibility of the confidentiality of this discharge information lies with you and/or your care-partner.  Resume diet.  Continue protonix, 40mg . Twice daily.  No aspirin, ibuprofen, naproxen, or other non steroidal anti inflammatory drugs for now.  CBC Monday.   Any worsening sx. In the mealtime , call Gi after hours line or go the hospital for further evaluation.

## 2016-11-02 NOTE — Progress Notes (Signed)
Called to room to assist during endoscopic procedure.  Patient ID and intended procedure confirmed with present staff. Received instructions for my participation in the procedure from the performing physician.  

## 2016-11-02 NOTE — Op Note (Signed)
[place] Patient Name: Montrel Donahoe Procedure Date: 11/02/2016 7:27 AM MRN: [id number] Endoscopist: Remo Lipps P. Jaidyn Kuhl MD, MD Age: 76 Referring MD:  Date of Birth: 05-02-1941 Gender: Male Account #: [id number] Procedure:                Upper GI endoscopy Indications:              change in bowel habits, dark stool with mild                            anemia, concern for slow upper GI bleeding Medicines:                Monitored Anesthesia Care Procedure:                Pre-Anesthesia Assessment:                           - Prior to the procedure, a History and Physical                            was performed, and patient medications and                            allergies were reviewed. The patient's tolerance of                            previous anesthesia was also reviewed. The risks                            and benefits of the procedure and the sedation                            options and risks were discussed with the patient.                            All questions were answered, and informed consent                            was obtained. Prior Anticoagulants: The patient has                            taken aspirin, last dose was 1 day prior to                            procedure. ASA Grade Assessment: II - A patient                            with mild systemic disease. After reviewing the                            risks and benefits, the patient was deemed in                            satisfactory condition to undergo the procedure.  After obtaining informed consent, the endoscope was                            passed under direct vision. Throughout the                            procedure, the patient's blood pressure, pulse, and                            oxygen saturations were monitored continuously. The                            Endoscope was introduced through the mouth, and                            advanced to  the second part of duodenum. The upper                            GI endoscopy was accomplished without difficulty.                            The patient tolerated the procedure well. Scope In: Scope Out: Findings:                 Esophagogastric landmarks were identified: the                            Z-line was found at 40 cm, the gastroesophageal                            junction was found at 40 cm and the upper extent of                            the gastric folds was found at 40 cm from the                            incisors.                           The exam of the esophagus was otherwise normal.                           Patchy moderate inflammation characterized by                            erosions and erythema was found in the gastric                            antrum, without focal ulceration. Biopsies were                            taken with a cold forceps for Helicobacter pylori  testing.                           Multiple small sessile polyps were found in the                            gastric fundus and in the gastric body. Two                            representative polyps were removed with a cold                            biopsy forceps. Resection and retrieval were                            complete.                           The exam of the stomach was otherwise normal.                           A single 15 to 20 mm nodular polypoid lesion was                            found in the duodenal bulb. There was a focal area                            of clean based ulceration a few mm in size without                            any high risk stigmata for bleeding. Biopsies were                            taken with a cold forceps for histology.                           Diffuse mildly erythematous mucosa was found in the                            duodenal bulb.                           The exam of the duodenum was otherwise  normal. Complications:            No immediate complications. Estimated blood loss:                            Minimal. Estimated Blood Loss:     Estimated blood loss was minimal. Impression:               - Esophagogastric landmarks identified.                           - Normal esophagus                           -  Gastritis. Biopsied.                           - Benign appearing small gastric polyps, a few were                            removed as representative samples.                           - Nodular polypoid lesion found in the duodenum                            with small clean based ulceration. I suspect this                            is the cause of the patient's symptoms. No high                            risk stigmata for bleeding appreciated.                            Differential includes adenomatous change,                            hypertrophied duodenal folds / duodenitis, versus                            other subepithelial lesion . Biopsies obtained.                           - Erythematous duodenopathy. Recommendation:           - Patient has a contact number available for                            emergencies. The signs and symptoms of potential                            delayed complications were discussed with the                            patient. Return to normal activities tomorrow.                            Written discharge instructions were provided to the                            patient.                           - Resume previous diet.                           - Continue present medications (protonix 40mg  twice  daily)                           - No aspirin, ibuprofen, naproxen, or other                            non-steroidal anti-inflammatory drugs for now.                           - repeat CBC Monday                           - if any worsening symptoms in the interim, call                            the GI  after hours line or go to the hospital for                            further evaluation                           - await pathology results with further                            recommendations. Remo Lipps P. Rodriquez Thorner MD, MD 11/02/2016 7:57:19 AM This report has been signed electronically.

## 2016-11-05 ENCOUNTER — Other Ambulatory Visit: Payer: Self-pay

## 2016-11-05 ENCOUNTER — Telehealth: Payer: Self-pay | Admitting: *Deleted

## 2016-11-05 ENCOUNTER — Other Ambulatory Visit (INDEPENDENT_AMBULATORY_CARE_PROVIDER_SITE_OTHER): Payer: Medicare Other

## 2016-11-05 ENCOUNTER — Encounter: Payer: Medicare Other | Admitting: Internal Medicine

## 2016-11-05 DIAGNOSIS — D649 Anemia, unspecified: Secondary | ICD-10-CM | POA: Diagnosis not present

## 2016-11-05 LAB — CBC
HCT: 36.3 % — ABNORMAL LOW (ref 39.0–52.0)
Hemoglobin: 12.3 g/dL — ABNORMAL LOW (ref 13.0–17.0)
MCHC: 33.9 g/dL (ref 30.0–36.0)
MCV: 90.7 fl (ref 78.0–100.0)
Platelets: 221 10*3/uL (ref 150.0–400.0)
RBC: 4 Mil/uL — ABNORMAL LOW (ref 4.22–5.81)
RDW: 14.9 % (ref 11.5–15.5)
WBC: 5.2 10*3/uL (ref 4.0–10.5)

## 2016-11-05 NOTE — Telephone Encounter (Signed)
  Follow up Call-  Call back number 11/02/2016 07/18/2016  Post procedure Call Back phone  # [phone]  Permission to leave phone message Yes Yes  Some recent data might be hidden     Patient questions:  Do you have a fever, pain , or abdominal swelling? No. Pain Score  0 *  Have you tolerated food without any problems? Yes.    Have you been able to return to your normal activities? Yes.    Do you have any questions about your discharge instructions: Diet   No. Medications  No. Follow up visit  No.  Do you have questions or concerns about your Care? No.  Actions: * If pain score is 4 or above: No action needed, pain <4.

## 2016-11-08 ENCOUNTER — Telehealth: Payer: Self-pay | Admitting: Internal Medicine

## 2016-11-08 NOTE — Telephone Encounter (Signed)
Left message with patient wife for patient to callback to schedule Recall EGD for July. Please see 11/02/16 phone note and recall in system.

## 2016-11-11 NOTE — Progress Notes (Addendum)
Cardiology Office Note   Date:  11/12/2016   ID:  Jeremy Klein, DOB March 18, 1941, MRN [id number]  PCP:  Precious Reel, MD  Cardiologist:   [name], MD   F/U of MV dz     History of Present Illness: Jeremy Klein is a 76 y.o. male with a history of MVP  (s/p repair at [facility] in 2003), moderate CAD in 2003, HL  I saw him in April 2017   SInce seen he has done fairly well  He denies CP Takes dog for walk 2x per day  Only time gets SOB is walking up hill (no change) Denies dizziness  No CP     Outpatient Medications Prior to Visit  Medication Sig Dispense Refill  . amLODipine (NORVASC) 5 MG tablet TAKE 1 TABLET BY MOUTH DAILY 90 tablet 3  . atorvastatin (LIPITOR) 40 MG tablet Take 1 tablet (40 mg total) by mouth daily at 6 PM. 90 tablet 3  . Multiple Vitamins-Minerals (ICAPS PO) Take by mouth. 2 by mouth in the am, 2 by mouth in the pm     . pantoprazole (PROTONIX) 40 MG tablet Take 1 tablet (40 mg total) by mouth 2 (two) times daily. 45 tablet 4  . Saw Palmetto 500 MG CAPS Take 1 capsule by mouth daily.     Marland Kitchen aspirin 325 MG tablet Take 325 mg by mouth daily.       Facility-Administered Medications Prior to Visit  Medication Dose Route Frequency Provider Last Rate Last Dose  . 0.9 %  sodium chloride infusion  500 mL Intravenous Continuous Jerene Bears, MD      . 0.9 %  sodium chloride infusion  500 mL Intravenous Continuous Manus Gunning, MD         Allergies:   Patient has no known allergies.   Past Medical History:  Diagnosis Date  . Cataract   . Colonic polyp    tubular & hyperplasticDr Sharlett Iles  . Coronary artery disease    Dr Harrington Challenger; not significant  . Heart murmur   . Hyperlipidemia   . Hypertension   . Low back pain    intermittent  . Macular degeneration    Dr Cordelia Pen, [facility]  . Mitral valve prolapse    S/P  repair 2003    Past Surgical History:  Procedure Laterality Date  . APPENDECTOMY    . CARDIAC CATHETERIZATION  2003  . COLONOSCOPY    .  COLONOSCOPY W/ POLYPECTOMY  09/2012    Dr Fidela [facility]  . MV repair  2003   Dr Amador Cunas, [facility]     Social History:  The patient  reports that he quit smoking about 38 years ago. He has never used smokeless tobacco. He reports that he drinks about 1.8 oz of alcohol per week . He reports that he does not use drugs.   Family History:  The patient's family history includes Glaucoma in his father; Heart attack (age of onset: 93) in his father; Hypertension (age of onset: 75) in his mother; Prostate cancer (age of onset: 54) in his father; Skin cancer in his father.    ROS:  Please see the history of present illness. All other systems are reviewed and  Negative to the above problem except as noted.    PHYSICAL EXAM: VS:  BP 130/68   Pulse 60   Ht 6' (1.829 m)   Wt 217 lb 12.8 oz (98.8 kg)   BMI 29.54 kg/m   GEN:  Well nourished, well developed, in no acute distress HEENT: normal Neck: no JVD, carotid bruits, or masses Cardiac: RRR; no murmurs, rubs, or gallops,no edema  Respiratory:  clear to auscultation bilaterally, normal work of breathing GI: soft, nontender, nondistended, + BS  No hepatomegaly  MS: no deformity Moving all extremities   Skin: warm and dry, no rash Neuro:  Strength and sensation are intact Psych: euthymic mood, full affect   EKG:  EKG is ordered today.  SR 61 bpm    Lipid Panel    Component Value Date/Time   CHOL 167 11/11/2015 0844   TRIG 117 11/11/2015 0844   TRIG 81 06/14/2006 1056   HDL 63 11/11/2015 0844   CHOLHDL 2.7 11/11/2015 0844   VLDL 23 11/11/2015 0844   LDLCALC 81 11/11/2015 0844      Wt Readings from Last 3 Encounters:  11/12/16 217 lb 12.8 oz (98.8 kg)  11/02/16 217 lb (98.4 kg)  11/01/16 217 lb (98.4 kg)      ASSESSMENT AND PLAN:  1  MV dz  Excellent repair in 2003 at [facility]  No murmur    2  HL  Will check fasting lipids    3  CAD  I am not convinced of active symptoms   Follow   Signed, [name], MD  11/12/2016  11:07 AM    [address], [place], [place]  [address] Phone: [phone]; Fax: [phone]

## 2016-11-12 ENCOUNTER — Encounter: Payer: Self-pay | Admitting: Internal Medicine

## 2016-11-12 ENCOUNTER — Ambulatory Visit (INDEPENDENT_AMBULATORY_CARE_PROVIDER_SITE_OTHER): Payer: Medicare Other | Admitting: Internal Medicine

## 2016-11-12 VITALS — BP 130/68 | HR 60 | Ht 72.0 in | Wt 217.8 lb

## 2016-11-12 DIAGNOSIS — E782 Mixed hyperlipidemia: Secondary | ICD-10-CM

## 2016-11-12 DIAGNOSIS — I2510 Atherosclerotic heart disease of native coronary artery without angina pectoris: Secondary | ICD-10-CM

## 2016-11-12 DIAGNOSIS — I059 Rheumatic mitral valve disease, unspecified: Secondary | ICD-10-CM | POA: Diagnosis not present

## 2016-11-12 NOTE — Patient Instructions (Signed)
Your physician recommends that you continue on your current medications as directed. Please refer to the Current Medication list given to you today. Your physician recommends that you return for lab work (LIPIDS) Your physician wants you to follow-up in: 1 year with Dr. Ross.  You will receive a reminder letter in the mail two months in advance. If you don't receive a letter, please call our office to schedule the follow-up appointment.  

## 2016-11-13 ENCOUNTER — Other Ambulatory Visit: Payer: Medicare Other | Admitting: *Deleted

## 2016-11-13 DIAGNOSIS — E782 Mixed hyperlipidemia: Secondary | ICD-10-CM | POA: Diagnosis not present

## 2016-11-13 DIAGNOSIS — I2510 Atherosclerotic heart disease of native coronary artery without angina pectoris: Secondary | ICD-10-CM

## 2016-11-13 LAB — LIPID PANEL
Chol/HDL Ratio: 3 ratio (ref 0.0–5.0)
Cholesterol, Total: 159 mg/dL (ref 100–199)
HDL: 53 mg/dL (ref >39)
LDL Calculated: 77 mg/dL (ref 0–99)
Triglycerides: 143 mg/dL (ref 0–149)
VLDL Cholesterol Cal: 29 mg/dL (ref 5–40)

## 2016-12-06 DIAGNOSIS — H1012 Acute atopic conjunctivitis, left eye: Secondary | ICD-10-CM | POA: Diagnosis not present

## 2016-12-06 DIAGNOSIS — H0014 Chalazion left upper eyelid: Secondary | ICD-10-CM | POA: Diagnosis not present

## 2016-12-06 DIAGNOSIS — H353132 Nonexudative age-related macular degeneration, bilateral, intermediate dry stage: Secondary | ICD-10-CM | POA: Diagnosis not present

## 2016-12-06 DIAGNOSIS — H04123 Dry eye syndrome of bilateral lacrimal glands: Secondary | ICD-10-CM | POA: Diagnosis not present

## 2016-12-17 ENCOUNTER — Telehealth: Payer: Self-pay | Admitting: Internal Medicine

## 2016-12-17 ENCOUNTER — Telehealth: Payer: Self-pay | Admitting: *Deleted

## 2016-12-17 NOTE — Telephone Encounter (Signed)
Did a Prior Authorization for the Pantoprazole sodium 40 mg today 12-17-2016 with [employer].  Got an approval back . The authorization is valid from 11/17/2016 through 12/17/2017.  They do back date it.  I have informed the patient  by phone today, 12/17/2016.

## 2016-12-17 NOTE — Telephone Encounter (Signed)
A user error has taken place: ERROR °

## 2017-01-03 ENCOUNTER — Other Ambulatory Visit: Payer: Self-pay | Admitting: *Deleted

## 2017-01-03 DIAGNOSIS — E782 Mixed hyperlipidemia: Secondary | ICD-10-CM

## 2017-01-15 ENCOUNTER — Encounter: Payer: Self-pay | Admitting: *Deleted

## 2017-01-21 ENCOUNTER — Other Ambulatory Visit: Payer: Self-pay | Admitting: Internal Medicine

## 2017-01-28 ENCOUNTER — Other Ambulatory Visit (INDEPENDENT_AMBULATORY_CARE_PROVIDER_SITE_OTHER): Payer: Medicare Other

## 2017-01-28 ENCOUNTER — Ambulatory Visit (INDEPENDENT_AMBULATORY_CARE_PROVIDER_SITE_OTHER): Payer: Medicare Other | Admitting: Internal Medicine

## 2017-01-28 ENCOUNTER — Encounter: Payer: Self-pay | Admitting: Internal Medicine

## 2017-01-28 VITALS — BP 128/60 | HR 72 | Ht 72.0 in | Wt 217.0 lb

## 2017-01-28 DIAGNOSIS — K2980 Duodenitis without bleeding: Secondary | ICD-10-CM

## 2017-01-28 DIAGNOSIS — D649 Anemia, unspecified: Secondary | ICD-10-CM

## 2017-01-28 DIAGNOSIS — I2510 Atherosclerotic heart disease of native coronary artery without angina pectoris: Secondary | ICD-10-CM

## 2017-01-28 DIAGNOSIS — Z8719 Personal history of other diseases of the digestive system: Secondary | ICD-10-CM

## 2017-01-28 LAB — CBC WITH DIFFERENTIAL/PLATELET
Basophils Absolute: 0 10*3/uL (ref 0.0–0.1)
Basophils Relative: 0.6 % (ref 0.0–3.0)
Eosinophils Absolute: 0.1 10*3/uL (ref 0.0–0.7)
Eosinophils Relative: 1.1 % (ref 0.0–5.0)
HCT: 44.2 % (ref 39.0–52.0)
Hemoglobin: 15.1 g/dL (ref 13.0–17.0)
Lymphocytes Relative: 19.4 % (ref 12.0–46.0)
Lymphs Abs: 1.1 10*3/uL (ref 0.7–4.0)
MCHC: 34.1 g/dL (ref 30.0–36.0)
MCV: 87.7 fl (ref 78.0–100.0)
Monocytes Absolute: 0.4 10*3/uL (ref 0.1–1.0)
Monocytes Relative: 7.8 % (ref 3.0–12.0)
Neutro Abs: 4.1 10*3/uL (ref 1.4–7.7)
Neutrophils Relative %: 71.1 % (ref 43.0–77.0)
Platelets: 192 10*3/uL (ref 150.0–400.0)
RBC: 5.04 Mil/uL (ref 4.22–5.81)
RDW: 14.7 % (ref 11.5–15.5)
WBC: 5.7 10*3/uL (ref 4.0–10.5)

## 2017-01-28 NOTE — Patient Instructions (Addendum)
Your physician has requested that you go to the basement for the following lab work before leaving today: CBC  Decrease protonix (pantoprazole) to once daily.

## 2017-01-28 NOTE — Progress Notes (Signed)
Subjective:    Patient ID: Jeremy Klein, male    DOB: August 24, 1940, 76 y.o.   MRN: [id number]  HPI Jeremy Klein is a 76 yo male with PMH of duodenitis with ulcer, multiple colon polyps, seen on 11/01/2016 by Nicoletta Ba, PA-C to evaluate dark stools and anemia who seen for follow-up. He was last seen at the time of his endoscopy performed by Dr. Havery Moros on 11/02/2016. Dr. Havery Moros performed this endoscopy to speed evaluation given concern for bleeding.    Upper endoscopy revealed patchy moderate inflammation of the gastric antrum without ulceration. This was biopsied. There were multiple small benign-appearing polyps in the gastric fundus and body. There was a polypoid lesion in the duodenal bulb with small clean-based ulceration. This was biopsied extensively. Pathology from the gastric polyp showed benign fundic gland polyps. Antral biopsies showed benign gastric mucosa negative for H. pylori, metaplasia or active inflammation. The duodenal nodule represented gastric metaplasia felt peptic in nature. There is no evidence of malignancy.  He was treated with pantoprazole 40 mg twice a day which he has continued. At the time of the endoscopy he had been using aspirin 325 mg daily. He has subsequently been off of all aspirin and avoiding all NSAIDs. He has continued pantoprazole twice a day.  He reports he is feeling great though he is having some lumbar back pain to be evaluated by orthopedics later this week. This is not a new pain for him. He reports intaking back he would occasionally have some burning epigastric discomfort with eating which has completely resolved. No further black or dark stools. No visible red blood in stool. No nausea or vomiting. No abdominal pain. No early satiety. Appetite is "great".   Review of Systems As per HPI, otherwise negative  Current Medications, Allergies, Past Medical History, Past Surgical History, Family History and Social History were reviewed in  [employer] record.     Objective:   Physical Exam BP 128/60   Pulse 72   Ht 6' (1.829 m)   Wt 217 lb (98.4 kg)   BMI 29.43 kg/m  Constitutional: Well-developed and well-nourished. No distress. HEENT: Normocephalic and atraumatic. Oropharynx is clear and moist. Conjunctivae are normal.  No scleral icterus. Neck: Neck supple. Trachea midline. Cardiovascular: Normal rate, regular rhythm and intact distal pulses. No M/R/G Pulmonary/chest: Effort normal and breath sounds normal. No wheezing, rales or rhonchi. Abdominal: Soft, nontender, nondistended. Bowel sounds active throughout. There are no masses palpable. No hepatosplenomegaly. Extremities: no clubbing, cyanosis, or edema Neurological: Alert and oriented to person place and time. Skin: Skin is warm and dry. Psychiatric: Normal mood and affect. Behavior is normal.     Assessment & Plan:  76 yo male with PMH of duodenitis with ulcer, multiple colon polyps, seen on 11/01/2016 by Nicoletta Ba, PA-C to evaluate dark stools and anemia who seen for follow-up.  1. Gastroduodenitis/duodenal ulcer -- I expect this was NSAID related. Both his stomach and the duodenal nodule was biopsied and found to be benign. No evidence of H. pylori or dysplasia. Resolution of dark stools and burning epigastric discomfort with PPI. Given the ulcer was duodenal and biopsied, I do not feel that surveillance endoscopy is necessary at this time. We discussed this together and he is in agreement. I will reduce pantoprazole 40 mg once daily going for. I asked that he discuss a baby aspirin for prophylaxis with primary care and if it is recommended, it is okay with me. He will continue to  avoid NSAIDs never possible but should he need NSAIDs I would recommend that they be used in conjunction with PPI. --Repeat CBC today to ensure improvement in mild anemia  2. History of multiple colon polyps -- surveillance colonoscopy recommended at the 3  year interval which would be January 2021  Follow-up as needed 15 minutes spent with the patient today. Greater than 50% was spent in counseling and coordination of care with the patient

## 2017-01-29 DIAGNOSIS — M545 Low back pain: Secondary | ICD-10-CM | POA: Diagnosis not present

## 2017-02-04 DIAGNOSIS — H0014 Chalazion left upper eyelid: Secondary | ICD-10-CM | POA: Diagnosis not present

## 2017-02-28 DIAGNOSIS — H0014 Chalazion left upper eyelid: Secondary | ICD-10-CM | POA: Diagnosis not present

## 2017-02-28 DIAGNOSIS — H04123 Dry eye syndrome of bilateral lacrimal glands: Secondary | ICD-10-CM | POA: Diagnosis not present

## 2017-03-20 ENCOUNTER — Other Ambulatory Visit: Payer: Self-pay | Admitting: Physician Assistant

## 2017-04-02 DIAGNOSIS — H0014 Chalazion left upper eyelid: Secondary | ICD-10-CM | POA: Diagnosis not present

## 2017-04-02 DIAGNOSIS — H04123 Dry eye syndrome of bilateral lacrimal glands: Secondary | ICD-10-CM | POA: Diagnosis not present

## 2017-04-02 DIAGNOSIS — H2513 Age-related nuclear cataract, bilateral: Secondary | ICD-10-CM | POA: Diagnosis not present

## 2017-04-02 DIAGNOSIS — H353132 Nonexudative age-related macular degeneration, bilateral, intermediate dry stage: Secondary | ICD-10-CM | POA: Diagnosis not present

## 2017-05-01 DIAGNOSIS — H2511 Age-related nuclear cataract, right eye: Secondary | ICD-10-CM | POA: Diagnosis not present

## 2017-05-01 DIAGNOSIS — H0014 Chalazion left upper eyelid: Secondary | ICD-10-CM | POA: Diagnosis not present

## 2017-05-01 DIAGNOSIS — H02884 Meibomian gland dysfunction left upper eyelid: Secondary | ICD-10-CM | POA: Diagnosis not present

## 2017-05-02 ENCOUNTER — Other Ambulatory Visit: Payer: Self-pay | Admitting: Physician Assistant

## 2017-05-02 DIAGNOSIS — H0014 Chalazion left upper eyelid: Secondary | ICD-10-CM | POA: Diagnosis not present

## 2017-05-09 DIAGNOSIS — H01003 Unspecified blepharitis right eye, unspecified eyelid: Secondary | ICD-10-CM | POA: Diagnosis not present

## 2017-05-09 DIAGNOSIS — H04123 Dry eye syndrome of bilateral lacrimal glands: Secondary | ICD-10-CM | POA: Diagnosis not present

## 2017-05-09 DIAGNOSIS — Z87891 Personal history of nicotine dependence: Secondary | ICD-10-CM | POA: Diagnosis not present

## 2017-05-09 DIAGNOSIS — H01006 Unspecified blepharitis left eye, unspecified eyelid: Secondary | ICD-10-CM | POA: Diagnosis not present

## 2017-05-09 DIAGNOSIS — H0014 Chalazion left upper eyelid: Secondary | ICD-10-CM | POA: Diagnosis not present

## 2017-05-09 DIAGNOSIS — H02889 Meibomian gland dysfunction of unspecified eye, unspecified eyelid: Secondary | ICD-10-CM | POA: Diagnosis not present

## 2017-05-28 DIAGNOSIS — Z125 Encounter for screening for malignant neoplasm of prostate: Secondary | ICD-10-CM | POA: Diagnosis not present

## 2017-05-28 DIAGNOSIS — I10 Essential (primary) hypertension: Secondary | ICD-10-CM | POA: Diagnosis not present

## 2017-05-28 DIAGNOSIS — E7849 Other hyperlipidemia: Secondary | ICD-10-CM | POA: Diagnosis not present

## 2017-05-28 DIAGNOSIS — R82998 Other abnormal findings in urine: Secondary | ICD-10-CM | POA: Diagnosis not present

## 2017-06-04 DIAGNOSIS — I2510 Atherosclerotic heart disease of native coronary artery without angina pectoris: Secondary | ICD-10-CM | POA: Diagnosis not present

## 2017-06-04 DIAGNOSIS — E663 Overweight: Secondary | ICD-10-CM | POA: Diagnosis not present

## 2017-06-04 DIAGNOSIS — Z0000 Encounter for general adult medical examination without abnormal findings: Secondary | ICD-10-CM | POA: Diagnosis not present

## 2017-06-04 DIAGNOSIS — H3530 Unspecified macular degeneration: Secondary | ICD-10-CM | POA: Diagnosis not present

## 2017-06-04 DIAGNOSIS — I119 Hypertensive heart disease without heart failure: Secondary | ICD-10-CM | POA: Diagnosis not present

## 2017-06-04 DIAGNOSIS — E7849 Other hyperlipidemia: Secondary | ICD-10-CM | POA: Diagnosis not present

## 2017-06-04 DIAGNOSIS — H04129 Dry eye syndrome of unspecified lacrimal gland: Secondary | ICD-10-CM | POA: Diagnosis not present

## 2017-06-04 DIAGNOSIS — Z125 Encounter for screening for malignant neoplasm of prostate: Secondary | ICD-10-CM | POA: Diagnosis not present

## 2017-06-04 DIAGNOSIS — Z6829 Body mass index (BMI) 29.0-29.9, adult: Secondary | ICD-10-CM | POA: Diagnosis not present

## 2017-06-04 DIAGNOSIS — Z1389 Encounter for screening for other disorder: Secondary | ICD-10-CM | POA: Diagnosis not present

## 2017-06-04 DIAGNOSIS — I341 Nonrheumatic mitral (valve) prolapse: Secondary | ICD-10-CM | POA: Diagnosis not present

## 2017-06-04 DIAGNOSIS — R208 Other disturbances of skin sensation: Secondary | ICD-10-CM | POA: Diagnosis not present

## 2017-06-17 ENCOUNTER — Other Ambulatory Visit: Payer: Self-pay | Admitting: Physician Assistant

## 2017-07-10 ENCOUNTER — Other Ambulatory Visit: Payer: Self-pay | Admitting: Physician Assistant

## 2017-09-10 ENCOUNTER — Other Ambulatory Visit: Payer: Self-pay | Admitting: Internal Medicine

## 2017-10-19 ENCOUNTER — Other Ambulatory Visit: Payer: Self-pay | Admitting: Internal Medicine

## 2017-11-11 ENCOUNTER — Ambulatory Visit (INDEPENDENT_AMBULATORY_CARE_PROVIDER_SITE_OTHER): Payer: Medicare Other | Admitting: Internal Medicine

## 2017-11-11 VITALS — BP 144/80 | HR 55 | Ht 72.0 in | Wt 217.0 lb

## 2017-11-11 DIAGNOSIS — I2510 Atherosclerotic heart disease of native coronary artery without angina pectoris: Secondary | ICD-10-CM | POA: Diagnosis not present

## 2017-11-11 DIAGNOSIS — I059 Rheumatic mitral valve disease, unspecified: Secondary | ICD-10-CM | POA: Diagnosis not present

## 2017-11-11 DIAGNOSIS — E782 Mixed hyperlipidemia: Secondary | ICD-10-CM

## 2017-11-11 MED ORDER — EZETIMIBE 10 MG PO TABS: 10.0000 mg | ORAL_TABLET | Freq: Every day | ORAL | 3 refills | Status: DC

## 2017-11-11 NOTE — Patient Instructions (Addendum)
Your physician recommends that you continue on your current medications as directed. Please refer to the Current Medication list given to you today.  Your physician recommends that you return for lab work in: 2 months [facility] wants you to follow-up in: 1 year with Dr. Harrington Challenger.  You will receive a reminder letter in the mail two months in advance. If you don't receive a letter, please call our office to schedule the follow-up appointment.

## 2017-11-11 NOTE — Progress Notes (Signed)
Cardiology Office Note   Date:  11/11/2017   ID:  Jeremy Klein, DOB [birth year], MRN [id number]  PCP:  Shon Baton, MD  Cardiologist:   [name], MD   F/U of MV dz  And CAD     History of Present Illness: Jeremy Klein is a 77 y.o. male with a history of MVP  (s/p repair at [facility] in 2003), moderate CAD in 2003 of LAD and , HL  I saw him in April 2018  He has done well overall  Breathing is OK  He denies CP   Walks some    Couple episodes of dizzienss  Episodes occurred while has was seated   3x   Last night watching TV had a spell   Short lived   Outpatient Medications Prior to Visit  Medication Sig Dispense Refill  . amLODipine (NORVASC) 5 MG tablet Take 1 tablet (5 mg total) by mouth daily. Please keep upcoming appt for future refills. Thank you 90 tablet 0  . atorvastatin (LIPITOR) 40 MG tablet Take 1 tablet (40 mg total) by mouth daily at 6 PM. Please keep upcoming appt for future refills. Thank you 90 tablet 0  . Multiple Vitamins-Minerals (ICAPS PO) Take by mouth. 2 by mouth in the am, 2 by mouth in the pm     . pantoprazole (PROTONIX) 40 MG tablet Take 1 tablet (40 mg total) by mouth daily. 90 tablet 3  . Saw Palmetto 500 MG CAPS Take 1 capsule by mouth daily.      Facility-Administered Medications Prior to Visit  Medication Dose Route Frequency Provider Last Rate Last Dose  . 0.9 %  sodium chloride infusion  500 mL Intravenous Continuous Pyrtle, Lajuan Lines, MD      . 0.9 %  sodium chloride infusion  500 mL Intravenous Continuous Armbruster, Carlota Raspberry, MD         Allergies:   Patient has no known allergies.   Past Medical History:  Diagnosis Date  . Cataract   . Colonic polyp    tubular & hyperplasticDr Sharlett Iles  . Coronary artery disease    Dr Harrington Challenger; not significant  . Gastric nodule    with gastric metaplasia  . Heart murmur   . Hyperlipidemia   . Hypertension   . Low back pain    intermittent  . Macular degeneration    Dr Cordelia Pen, [facility]  . Mitral valve  prolapse    S/P  repair 2003  . Tubular adenoma of colon     Past Surgical History:  Procedure Laterality Date  . APPENDECTOMY    . CARDIAC CATHETERIZATION  2003  . COLONOSCOPY    . COLONOSCOPY W/ POLYPECTOMY  09/2012    Dr F[facility]a [facility]  . MV repair  2003   Dr Amador Cunas, [facility]     Social History:  The patient  reports that he quit smoking about 39 years ago. He has never used smokeless tobacco. He reports that he drinks about 1.8 oz of alcohol per week. He reports that he does not use drugs.   Family History:  The patient's family history includes Glaucoma in his father; Heart attack (age of onset: 48) in his father; Hypertension (age of onset: 2) in his mother; Prostate cancer (age of onset: 28) in his father; Skin cancer in his father.    ROS:  Please see the history of present illness. All other systems are reviewed and  Negative to the above problem except as noted.  PHYSICAL EXAM: VS:  BP (!) 144/80   Pulse (!) 55   Ht 6' (1.829 m)   Wt 217 lb (98.4 kg)   SpO2 98%   BMI 29.43 kg/m   GEN: Well nourished, well developed, in no acute distress HEENT: normal Neck: no JVD, carotid bruits, or masses Cardiac: RRR; no murmurs, rubs, or gallops,no edema  Respiratory:  clear to auscultation bilaterally, normal work of breathing GI: soft, nontender, nondistended, + BS  No hepatomegaly  MS: no deformity Moving all extremities   Skin: warm and dry, no rash Neuro:  Strength and sensation are intact Psych: euthymic mood, full affect   EKG:  EKG is ordered today.  SB 55 bpm    Lipid Panel    Component Value Date/Time   CHOL 159 11/13/2016 0852   TRIG 143 11/13/2016 0852   TRIG 81 06/14/2006 1056   HDL 53 11/13/2016 0852   CHOLHDL 3.0 11/13/2016 0852   CHOLHDL 2.7 11/11/2015 0844   VLDL 23 11/11/2015 0844   LDLCALC 77 11/13/2016 0852      Wt Readings from Last 3 Encounters:  11/11/17 217 lb (98.4 kg)  01/28/17 217 lb (98.4 kg)  11/12/16 217 lb 12.8 oz (98.8  kg)      ASSESSMENT AND PLAN:  1  MV dz  Excellent repair in 2003 at [facility]  No murmur on exam  2   CAD   I am not convinced of active symptoms   Sty aciv 2  HL  Will check fasting lipids today   He is on pravastatin 80    4  HTN  BP is a little high today   On my check 138/80   PRevious clinic visits BP was good I encouraged him to get a BP cuff to follow   BP at home  5  Dizziness  Not clear what these represent   Keep track of BP    I encouraged him to increase his exercise   F/U in 1 year   Signed, [name], MD  11/11/2017 10:04 AM    [address], [place], [place]  [address] Phone: [phone]; Fax: [phone]

## 2017-12-11 ENCOUNTER — Encounter: Payer: Self-pay | Admitting: Internal Medicine

## 2017-12-12 DIAGNOSIS — H1012 Acute atopic conjunctivitis, left eye: Secondary | ICD-10-CM | POA: Diagnosis not present

## 2017-12-12 DIAGNOSIS — H0014 Chalazion left upper eyelid: Secondary | ICD-10-CM | POA: Diagnosis not present

## 2017-12-12 DIAGNOSIS — H04123 Dry eye syndrome of bilateral lacrimal glands: Secondary | ICD-10-CM | POA: Diagnosis not present

## 2017-12-12 DIAGNOSIS — H353132 Nonexudative age-related macular degeneration, bilateral, intermediate dry stage: Secondary | ICD-10-CM | POA: Diagnosis not present

## 2017-12-16 ENCOUNTER — Encounter: Payer: Self-pay | Admitting: Internal Medicine

## 2017-12-16 ENCOUNTER — Telehealth: Payer: Self-pay | Admitting: Internal Medicine

## 2017-12-16 DIAGNOSIS — I10 Essential (primary) hypertension: Secondary | ICD-10-CM

## 2017-12-16 MED ORDER — TRIAMTERENE-HCTZ 37.5-25 MG PO TABS: 0.5000 | ORAL_TABLET | Freq: Every day | ORAL | 6 refills | Status: DC

## 2017-12-16 NOTE — Addendum Note (Signed)
Addended by: Rodman Key on: 12/16/2017 03:54 PM   Modules accepted: Orders

## 2017-12-16 NOTE — Telephone Encounter (Signed)
Pt callled last week   Tuesday BP was running high 160s to 180 at time Pt dizzy earlier when walking dog   BP high  I went to his home   BP elevated 180/   His BP cuff correlated well with my cuff BP same in both arms  Recomm:   Add Maxzide to regimen   1/2 tab  37.5/25   Mg This was called in to [facility]   Continue other meds Keep track of BP     Pt will need to come in to get BMET checked this week   Thursday/Wed.

## 2017-12-16 NOTE — Telephone Encounter (Signed)
Spoke with patient. He has taken Maxzide for past 3 days only. Will come Fri 12/20/17 for labs. Had previous appt to check lipids 6/18--moved this up to Friday as well.

## 2017-12-20 ENCOUNTER — Other Ambulatory Visit: Payer: Medicare Other | Admitting: *Deleted

## 2017-12-20 DIAGNOSIS — E782 Mixed hyperlipidemia: Secondary | ICD-10-CM

## 2017-12-20 DIAGNOSIS — I10 Essential (primary) hypertension: Secondary | ICD-10-CM

## 2017-12-20 LAB — BASIC METABOLIC PANEL
BUN/Creatinine Ratio: 17 (ref 10–24)
BUN: 16 mg/dL (ref 8–27)
CO2: 24 mmol/L (ref 20–29)
Calcium: 9.1 mg/dL (ref 8.6–10.2)
Chloride: 101 mmol/L (ref 96–106)
Creatinine, Ser: 0.93 mg/dL (ref 0.76–1.27)
GFR calc Af Amer: 92 mL/min/{1.73_m2} (ref >59)
GFR calc non Af Amer: 79 mL/min/{1.73_m2} (ref >59)
Glucose: 96 mg/dL (ref 65–99)
Potassium: 4.3 mmol/L (ref 3.5–5.2)
Sodium: 139 mmol/L (ref 134–144)

## 2017-12-20 LAB — LIPID PANEL
Chol/HDL Ratio: 2.3 ratio (ref 0.0–5.0)
Cholesterol, Total: 138 mg/dL (ref 100–199)
HDL: 60 mg/dL (ref >39)
LDL Calculated: 58 mg/dL (ref 0–99)
Triglycerides: 102 mg/dL (ref 0–149)
VLDL Cholesterol Cal: 20 mg/dL (ref 5–40)

## 2017-12-31 ENCOUNTER — Other Ambulatory Visit: Payer: Medicare Other

## 2018-01-06 ENCOUNTER — Other Ambulatory Visit: Payer: Self-pay | Admitting: Internal Medicine

## 2018-01-07 DIAGNOSIS — D1801 Hemangioma of skin and subcutaneous tissue: Secondary | ICD-10-CM | POA: Diagnosis not present

## 2018-01-07 DIAGNOSIS — L821 Other seborrheic keratosis: Secondary | ICD-10-CM | POA: Diagnosis not present

## 2018-01-07 DIAGNOSIS — L570 Actinic keratosis: Secondary | ICD-10-CM | POA: Diagnosis not present

## 2018-01-21 ENCOUNTER — Other Ambulatory Visit: Payer: Self-pay | Admitting: Internal Medicine

## 2018-02-05 NOTE — Telephone Encounter (Signed)
This encounter was created in error - please disregard.

## 2018-02-12 ENCOUNTER — Telehealth: Payer: Self-pay | Admitting: Gastroenterology

## 2018-02-13 NOTE — Telephone Encounter (Signed)
Called the patient to advise I did get an approval for the Prior authorization on the once daily Pantoprazole sodium 40 mg, # 90.  Also called the pharmacy, [facility] [address]/ [employer].  The PA is good until 02-13-2019.

## 2018-05-23 ENCOUNTER — Telehealth: Payer: Self-pay | Admitting: Gastroenterology

## 2018-05-23 NOTE — Telephone Encounter (Signed)
Dr Hilarie Fredrickson, please advise... Does he need to continue PPI or can he d/c if no longer taking NSAID's?

## 2018-05-23 NOTE — Telephone Encounter (Signed)
Without NSAID use and without using aspirin no more than 81 mg daily he can likely stop pantoprazole/PPI Should he develop recurrent epigastric abdominal pain, nausea, vomiting, blood in stool or dark stool he should let us know

## 2018-05-26 NOTE — Telephone Encounter (Signed)
Left voicemail for patient to call back. 

## 2018-05-27 NOTE — Telephone Encounter (Signed)
I have spoken to patient who confirms that he is no longer taking NSAID's on any regular basis. He is advised that he may discontinue pantoprazole at this time pending he continues to withhold NSAID's. However, if he restarts NSAID's or develops abdominal pain, nausea, vomiting, blood in stool or dark stool, he is to contact us. He verbalizes understanding of this.

## 2018-06-03 DIAGNOSIS — Z125 Encounter for screening for malignant neoplasm of prostate: Secondary | ICD-10-CM | POA: Diagnosis not present

## 2018-06-03 DIAGNOSIS — I10 Essential (primary) hypertension: Secondary | ICD-10-CM | POA: Diagnosis not present

## 2018-06-03 DIAGNOSIS — R82998 Other abnormal findings in urine: Secondary | ICD-10-CM | POA: Diagnosis not present

## 2018-06-03 DIAGNOSIS — E7849 Other hyperlipidemia: Secondary | ICD-10-CM | POA: Diagnosis not present

## 2018-06-10 DIAGNOSIS — H3530 Unspecified macular degeneration: Secondary | ICD-10-CM | POA: Diagnosis not present

## 2018-06-10 DIAGNOSIS — I2510 Atherosclerotic heart disease of native coronary artery without angina pectoris: Secondary | ICD-10-CM | POA: Diagnosis not present

## 2018-06-10 DIAGNOSIS — M545 Low back pain: Secondary | ICD-10-CM | POA: Diagnosis not present

## 2018-06-10 DIAGNOSIS — Z1389 Encounter for screening for other disorder: Secondary | ICD-10-CM | POA: Diagnosis not present

## 2018-06-10 DIAGNOSIS — I341 Nonrheumatic mitral (valve) prolapse: Secondary | ICD-10-CM | POA: Diagnosis not present

## 2018-06-10 DIAGNOSIS — Z0000 Encounter for general adult medical examination without abnormal findings: Secondary | ICD-10-CM | POA: Diagnosis not present

## 2018-06-10 DIAGNOSIS — E663 Overweight: Secondary | ICD-10-CM | POA: Diagnosis not present

## 2018-06-10 DIAGNOSIS — I119 Hypertensive heart disease without heart failure: Secondary | ICD-10-CM | POA: Diagnosis not present

## 2018-06-10 DIAGNOSIS — R399 Unspecified symptoms and signs involving the genitourinary system: Secondary | ICD-10-CM | POA: Diagnosis not present

## 2018-06-10 DIAGNOSIS — E7849 Other hyperlipidemia: Secondary | ICD-10-CM | POA: Diagnosis not present

## 2018-06-10 DIAGNOSIS — Z6829 Body mass index (BMI) 29.0-29.9, adult: Secondary | ICD-10-CM | POA: Diagnosis not present

## 2018-06-10 DIAGNOSIS — H04129 Dry eye syndrome of unspecified lacrimal gland: Secondary | ICD-10-CM | POA: Diagnosis not present

## 2018-09-19 DIAGNOSIS — L821 Other seborrheic keratosis: Secondary | ICD-10-CM | POA: Diagnosis not present

## 2018-10-15 ENCOUNTER — Other Ambulatory Visit: Payer: Self-pay | Admitting: Internal Medicine

## 2018-10-29 ENCOUNTER — Other Ambulatory Visit: Payer: Self-pay | Admitting: Internal Medicine

## 2018-11-14 ENCOUNTER — Ambulatory Visit: Payer: Medicare Other | Admitting: Internal Medicine

## 2018-11-26 ENCOUNTER — Other Ambulatory Visit: Payer: Self-pay | Admitting: Internal Medicine

## 2018-12-02 ENCOUNTER — Other Ambulatory Visit: Payer: Self-pay | Admitting: Physician Assistant

## 2018-12-17 ENCOUNTER — Encounter: Payer: Self-pay | Admitting: *Deleted

## 2018-12-18 ENCOUNTER — Other Ambulatory Visit: Payer: Self-pay

## 2018-12-18 ENCOUNTER — Other Ambulatory Visit: Payer: Self-pay | Admitting: Internal Medicine

## 2018-12-18 ENCOUNTER — Encounter: Payer: Self-pay | Admitting: Internal Medicine

## 2018-12-18 ENCOUNTER — Ambulatory Visit (INDEPENDENT_AMBULATORY_CARE_PROVIDER_SITE_OTHER): Payer: Medicare Other | Admitting: Internal Medicine

## 2018-12-18 VITALS — Ht 72.0 in | Wt 220.0 lb

## 2018-12-18 DIAGNOSIS — Z86010 Personal history of colonic polyps: Secondary | ICD-10-CM

## 2018-12-18 DIAGNOSIS — H43813 Vitreous degeneration, bilateral: Secondary | ICD-10-CM | POA: Diagnosis not present

## 2018-12-18 DIAGNOSIS — K219 Gastro-esophageal reflux disease without esophagitis: Secondary | ICD-10-CM | POA: Diagnosis not present

## 2018-12-18 DIAGNOSIS — H2513 Age-related nuclear cataract, bilateral: Secondary | ICD-10-CM | POA: Diagnosis not present

## 2018-12-18 DIAGNOSIS — R1013 Epigastric pain: Secondary | ICD-10-CM | POA: Diagnosis not present

## 2018-12-18 DIAGNOSIS — H04123 Dry eye syndrome of bilateral lacrimal glands: Secondary | ICD-10-CM | POA: Diagnosis not present

## 2018-12-18 DIAGNOSIS — H353132 Nonexudative age-related macular degeneration, bilateral, intermediate dry stage: Secondary | ICD-10-CM | POA: Diagnosis not present

## 2018-12-18 MED ORDER — PANTOPRAZOLE SODIUM 40 MG PO TBEC: 40.0000 mg | DELAYED_RELEASE_TABLET | Freq: Every day | ORAL | 1 refills | Status: DC

## 2018-12-18 NOTE — Patient Instructions (Addendum)
We have sent the following medications to your pharmacy for you to pick up at your convenience: Pantoprazole 40 mg 30 minutes before breakfast once daily x 1-2 months.  Please call our office if symptoms fail to improve with Pantoprazole.  If symptoms have completely abated, you may try to discontinue pantoprazole after 1-2 months.  Please purchase the following medications over the counter and take as directed: Gaviscon as per bottle instruction -if needed for breakthrough symptoms  You will be due for a recall colonoscopy in 07/2019. We will send you a reminder in the mail when it gets closer to that time.  CC: Dr. Virgina Jock

## 2018-12-18 NOTE — Addendum Note (Signed)
Addended by: Larina Bras on: 12/18/2018 12:03 PM   Modules accepted: Orders

## 2018-12-18 NOTE — Progress Notes (Signed)
Subjective:   This service was provided via telemedicine.  Telephone encounter The patient was located at home The provider was located in provider's GI office. The patient did consent to this telephone visit and is aware of possible charges through their insurance for this visit.   The persons participating in this telemedicine service were the patient and I. Time spent on call: 10 min    Patient ID: Jeremy Klein, male    DOB: 05/14/1941, 78 y.o.   MRN: [id number]  HPI Jeremy Klein is a 78 year old male with a history of duodenitis with ulcer, multiple adenomatous colon polyps who is seen for follow-up.  He is seen virtually today in the setting of COVID-19.  He also has a history of hypertension, hyperlipidemia.  He had an upper endoscopy performed by Dr. Havery Moros on 11/02/2016. Upper endoscopy revealed patchy moderate inflammation of the gastric antrum without ulceration. This was biopsied. There were multiple small benign-appearing polyps in the gastric fundus and body. There was a polypoid lesion in the duodenal bulb with small clean-based ulceration. This was biopsied extensively. Pathology from the gastric polyp showed benign fundic gland polyps. Antral biopsies showed benign gastric mucosa negative for H. pylori, metaplasia or active inflammation. The duodenal nodule represented gastric metaplasia felt peptic in nature. There is no evidence of malignancy.  His last colonoscopy was in January 2018.  He had 7 tubular adenomas removed, diverticulosis and internal hemorrhoids.  He reports that he had taken pantoprazole for several months after his last visit in 2018.  He had had no recurring epigastric symptoms or reflux symptoms and so he stopped the medication and has been off since.  He is also avoided NSAIDs and rarely uses an Aleve at this point.  He also never resumed his previous aspirin therapy.  In the last 4 to 6 weeks he has had several instances of burning epigastric  discomfort.  He does occasionally have some heartburn symptom as well.  These are reminiscent of the early symptoms he had that led to the evaluation in 2018.  He has had no nausea or vomiting.  No change in his appetite.  No trouble swallowing.  No change in his bowel habit, dark or black stool or blood in his stool.   Review of Systems As per HPI, otherwise negative  Current Medications, Allergies, Past Medical History, Past Surgical History, Family History and Social History were reviewed in [employer] record.     Objective:   Physical Exam No physical exam, virtual visit     Assessment & Plan:  78 year old male with a history of duodenitis with ulcer, multiple adenomatous colon polyps who is seen for follow-up.  1.  Epigastric pain/GERD --return of dyspeptic symptoms having been off of PPI therapy for some time.  No real alarm symptom and with the evaluation in 2018 symptoms likely represent a flare of his reflux and possibly gastroduodenitis.  I recommended the following --Pantoprazole 40 mg 30 minutes before breakfast daily for 4 to 8 weeks; if symptoms worsen or fail to improve with PPI therapy he is asked to notify me. --After 4 to 8 weeks of therapy if symptoms have completely abated he can tried to discontinue this medication --He has purchased Gaviscon which he can use per bottle instruction as needed if he has breakthrough symptoms --I offered 21-month follow-up but he prefers to let me know if symptoms are troublesome --Send prescription to [facility] on [address] --CC Dr. Virgina Jock  2.  History  of adenomatous colon polyps --7 adenomas removed 2-1/2 years ago, surveillance colonoscopy is recommended to be considered in January 2021.  I made him aware that he would be getting a recall letter late this year or early next.

## 2019-01-12 ENCOUNTER — Other Ambulatory Visit: Payer: Self-pay | Admitting: Internal Medicine

## 2019-01-13 DIAGNOSIS — D3617 Benign neoplasm of peripheral nerves and autonomic nervous system of trunk, unspecified: Secondary | ICD-10-CM | POA: Diagnosis not present

## 2019-01-13 DIAGNOSIS — L570 Actinic keratosis: Secondary | ICD-10-CM | POA: Diagnosis not present

## 2019-01-13 DIAGNOSIS — L821 Other seborrheic keratosis: Secondary | ICD-10-CM | POA: Diagnosis not present

## 2019-01-13 DIAGNOSIS — L853 Xerosis cutis: Secondary | ICD-10-CM | POA: Diagnosis not present

## 2019-01-13 DIAGNOSIS — D1801 Hemangioma of skin and subcutaneous tissue: Secondary | ICD-10-CM | POA: Diagnosis not present

## 2019-01-13 DIAGNOSIS — L812 Freckles: Secondary | ICD-10-CM | POA: Diagnosis not present

## 2019-01-29 ENCOUNTER — Other Ambulatory Visit: Payer: Self-pay | Admitting: Internal Medicine

## 2019-01-29 NOTE — Telephone Encounter (Signed)
Please refill.  90 day supply is fine.  Pt has follow up scheduled next month.

## 2019-03-02 NOTE — Progress Notes (Signed)
Cardiology Office Note   Date:  03/05/2019   ID:  [name], [name] 1941/03/21, MRN [id number]  PCP:  Shon Baton, MD  Cardiologist:   [name], MD   F/U of MV dz  And CAD     History of Present Illness: Jeremy Klein is a 78 y.o. male with a history of MVP  (s/p repair at [facility] in 2003), moderate CAD in 2003 of LAD and , HL  I saw him in April 2019 After that visit he was placed on Maxzide for better control of BP   Since seen he denies CP   No SOB  Notes some unsteadiness   Not severe  He has not fallen     Outpatient Medications Prior to Visit  Medication Sig Dispense Refill  . amLODipine (NORVASC) 5 MG tablet TAKE 1 TABLET BY MOUTH DAILY 90 tablet 1  . atorvastatin (LIPITOR) 40 MG tablet Take 1 tablet (40 mg total) by mouth daily at 6 PM. Please keep upcoming appt for future refills. Thank you 90 tablet 0  . ezetimibe (ZETIA) 10 MG tablet TAKE 1 TABLET BY MOUTH DAILY. PLEASE KEEP UPCOMING APPT. IN MAY WITH DOCTOR [name] FOR FUTURE REILLS. 90 tablet 0  . Multiple Vitamins-Minerals (ICAPS PO) Take by mouth. 2 by mouth in the am, 2 by mouth in the pm     . pantoprazole (PROTONIX) 40 MG tablet TAKE 1 TABLET BY MOUTH DAILY 30 MINUTES BEFORE BREAKFAST 90 tablet 0  . Saw Palmetto 500 MG CAPS Take 1 capsule by mouth daily.     Marland Kitchen triamterene-hydrochlorothiazide (MAXZIDE-25) 37.5-25 MG tablet Take 1 tablet by mouth daily. Please keep upcoming appt in August with Dr. Harrington Challenger. Thank you 30 tablet 2   No facility-administered medications prior to visit.      Allergies:   Patient has no known allergies.   Past Medical History:  Diagnosis Date  . Cataract   . Colonic polyp    tubular & hyperplasticDr Sharlett Iles  . Coronary artery disease    Dr Harrington Challenger; not significant  . Gastric nodule    with gastric metaplasia  . Heart murmur   . Hyperlipidemia   . Hypertension   . Low back pain    intermittent  . Macular degeneration    Dr Cordelia Pen, [place]  . Mitral valve prolapse    S/P  repair  2003  . Tubular adenoma of colon     Past Surgical History:  Procedure Laterality Date  . APPENDECTOMY    . CARDIAC CATHETERIZATION  2003  . COLONOSCOPY    . COLONOSCOPY W/ POLYPECTOMY  09/2012    Dr Fidela [facility]  . MITRAL VALVE REPAIR  2004   Dr Amador Cunas, [place]     Social History:  The patient  reports that he quit smoking about 40 years ago. He has never used smokeless tobacco. He reports current alcohol use of about 3.0 standard drinks of alcohol per week. He reports that he does not use drugs.   Family History:  The patient's family history includes Glaucoma in his father; Heart attack (age of onset: 50) in his father; Hypertension (age of onset: 22) in his mother; Prostate cancer (age of onset: 34) in his father; Skin cancer in his father.    ROS:  Please see the history of present illness. All other systems are reviewed and  Negative to the above problem except as noted.    PHYSICAL EXAM: VS:  BP 123/77   Pulse Marland Kitchen)  57   Ht 6' (1.829 m)   Wt 215 lb 3.2 oz (97.6 kg)   BMI 29.19 kg/m   GEN: Well nourished, well developed, in no acute distress HEENT: normal Neck: no JVD, carotid bruits Cardiac: RRR; no murmurs, rubs, or gallops,no edema  Respiratory:  clear to auscultation bilaterally, normal work of breathing GI: soft, nontender, nondistended, + BS  No hepatomegaly  MS: no deformity Moving all extremities   Skin: warm and dry, no rash Neuro:  Strength and sensation are intact Psych: euthymic mood, full affect   EKG:  EKG is ordered today.  SB 57 bpm    Lipid Panel    Component Value Date/Time   CHOL 138 12/20/2017 0752   TRIG 102 12/20/2017 0752   TRIG 81 06/14/2006 1056   HDL 60 12/20/2017 0752   CHOLHDL 2.3 12/20/2017 0752   CHOLHDL 2.7 11/11/2015 0844   VLDL 23 11/11/2015 0844   LDLCALC 58 12/20/2017 0752      Wt Readings from Last 3 Encounters:  03/05/19 215 lb 3.2 oz (97.6 kg)  12/18/18 220 lb (99.8 kg)  12/17/18 220 lb (99.8 kg)       ASSESSMENT AND PLAN:  1  MV dz  Excellent repair in 2003 at [place]    2   CAD   Mod at cath in 2003   No symptoms of angina  Check liopdis    2  HL   Last lipids in NOv 2019 LDL was 80  Check today   4  HTN  BP is fair   I would not push further due to rare dizzines/ unsteadiness   5  Unsteadiness   WIll consider referral to [name]  Re strength / stability reocmmendations  Encouraged increased activity       F/U in 1 year   Signed, [name], MD  03/05/2019 8:39 AM    [name], [place], [place]  [address] Phone: [phone]; Fax: [phone]

## 2019-03-05 ENCOUNTER — Other Ambulatory Visit: Payer: Self-pay

## 2019-03-05 ENCOUNTER — Ambulatory Visit (INDEPENDENT_AMBULATORY_CARE_PROVIDER_SITE_OTHER): Payer: Medicare Other | Admitting: Internal Medicine

## 2019-03-05 ENCOUNTER — Encounter: Payer: Self-pay | Admitting: Internal Medicine

## 2019-03-05 VITALS — BP 123/77 | HR 57 | Ht 72.0 in | Wt 215.2 lb

## 2019-03-05 DIAGNOSIS — E782 Mixed hyperlipidemia: Secondary | ICD-10-CM

## 2019-03-05 DIAGNOSIS — I2510 Atherosclerotic heart disease of native coronary artery without angina pectoris: Secondary | ICD-10-CM | POA: Diagnosis not present

## 2019-03-05 LAB — LIPID PANEL
Chol/HDL Ratio: 2.3 ratio (ref 0.0–5.0)
Cholesterol, Total: 149 mg/dL (ref 100–199)
HDL: 65 mg/dL (ref >39)
LDL Calculated: 67 mg/dL (ref 0–99)
Triglycerides: 86 mg/dL (ref 0–149)
VLDL Cholesterol Cal: 17 mg/dL (ref 5–40)

## 2019-03-05 LAB — BASIC METABOLIC PANEL
BUN/Creatinine Ratio: 16 (ref 10–24)
BUN: 15 mg/dL (ref 8–27)
CO2: 23 mmol/L (ref 20–29)
Calcium: 9.4 mg/dL (ref 8.6–10.2)
Chloride: 102 mmol/L (ref 96–106)
Creatinine, Ser: 0.96 mg/dL (ref 0.76–1.27)
GFR calc Af Amer: 88 mL/min/{1.73_m2} (ref >59)
GFR calc non Af Amer: 76 mL/min/{1.73_m2} (ref >59)
Glucose: 100 mg/dL — ABNORMAL HIGH (ref 65–99)
Potassium: 4.3 mmol/L (ref 3.5–5.2)
Sodium: 139 mmol/L (ref 134–144)

## 2019-03-05 LAB — CBC
Hematocrit: 45.6 % (ref 37.5–51.0)
Hemoglobin: 15.9 g/dL (ref 13.0–17.7)
MCH: 31.3 pg (ref 26.6–33.0)
MCHC: 34.9 g/dL (ref 31.5–35.7)
MCV: 90 fL (ref 79–97)
Platelets: 197 10*3/uL (ref 150–450)
RBC: 5.08 x10E6/uL (ref 4.14–5.80)
RDW: 13.1 % (ref 11.6–15.4)
WBC: 5.6 10*3/uL (ref 3.4–10.8)

## 2019-03-05 NOTE — Patient Instructions (Signed)
Medication Instructions:  No changes If you need a refill on your cardiac medications before your next appointment, please call your pharmacy.   Lab work: Today: cbc, bmet, lipids If you have labs (blood work) drawn today and your tests are completely normal, you will receive your results only by: Marland Kitchen MyChart Message (if you have MyChart) OR . A paper copy in the mail If you have any lab test that is abnormal or we need to change your treatment, we will call you to review the results.  Testing/Procedures: none  Follow-Up: At [facility], you and your health needs are our priority.  As part of our continuing mission to provide you with exceptional heart care, we have created designated Provider Care Teams.  These Care Teams include your primary Cardiologist (physician) and Advanced Practice Providers (APPs -  Physician Assistants and Nurse Practitioners) who all work together to provide you with the care you need, when you need it. You will need a follow up appointment in:  12 months.  Please call our office 2 months in advance to schedule this appointment.  You may see [name], MD or one of the following Advanced Practice Providers on your designated Care Team: Richardson Dopp, PA-C [place], [place] . Daune Perch, NP  Any Other Special Instructions Will Be Listed Below (If Applicable).

## 2019-03-15 ENCOUNTER — Other Ambulatory Visit: Payer: Self-pay | Admitting: Internal Medicine

## 2019-04-09 ENCOUNTER — Other Ambulatory Visit: Payer: Self-pay | Admitting: Internal Medicine

## 2019-04-29 ENCOUNTER — Other Ambulatory Visit: Payer: Self-pay | Admitting: Internal Medicine

## 2019-05-27 ENCOUNTER — Other Ambulatory Visit: Payer: Self-pay | Admitting: Internal Medicine

## 2019-06-15 DIAGNOSIS — R82998 Other abnormal findings in urine: Secondary | ICD-10-CM | POA: Diagnosis not present

## 2019-06-15 DIAGNOSIS — E7849 Other hyperlipidemia: Secondary | ICD-10-CM | POA: Diagnosis not present

## 2019-06-15 DIAGNOSIS — Z125 Encounter for screening for malignant neoplasm of prostate: Secondary | ICD-10-CM | POA: Diagnosis not present

## 2019-06-15 DIAGNOSIS — I10 Essential (primary) hypertension: Secondary | ICD-10-CM | POA: Diagnosis not present

## 2019-06-22 DIAGNOSIS — K219 Gastro-esophageal reflux disease without esophagitis: Secondary | ICD-10-CM | POA: Insufficient documentation

## 2019-07-28 DIAGNOSIS — Z23 Encounter for immunization: Secondary | ICD-10-CM | POA: Diagnosis not present

## 2019-08-12 ENCOUNTER — Ambulatory Visit: Payer: Medicare Other

## 2019-08-17 ENCOUNTER — Encounter: Payer: Self-pay | Admitting: Internal Medicine

## 2019-08-28 DIAGNOSIS — Z23 Encounter for immunization: Secondary | ICD-10-CM | POA: Diagnosis not present

## 2019-09-02 ENCOUNTER — Ambulatory Visit (AMBULATORY_SURGERY_CENTER): Payer: Medicare Other

## 2019-09-02 ENCOUNTER — Other Ambulatory Visit: Payer: Self-pay

## 2019-09-02 VITALS — Temp 96.0°F | Ht 72.0 in | Wt 215.0 lb

## 2019-09-02 DIAGNOSIS — Z86010 Personal history of colonic polyps: Secondary | ICD-10-CM

## 2019-09-02 MED ORDER — NA SULFATE-K SULFATE-MG SULF 17.5-3.13-1.6 GM/177ML PO SOLN: 1.0000 | Freq: Once | ORAL | 0 refills | Status: AC

## 2019-09-02 NOTE — Progress Notes (Signed)

## 2019-09-15 ENCOUNTER — Other Ambulatory Visit: Payer: Self-pay | Admitting: *Deleted

## 2019-09-15 MED ORDER — PANTOPRAZOLE SODIUM 40 MG PO TBEC: DELAYED_RELEASE_TABLET | ORAL | 1 refills | Status: DC

## 2019-09-16 ENCOUNTER — Other Ambulatory Visit: Payer: Self-pay

## 2019-09-16 ENCOUNTER — Ambulatory Visit (AMBULATORY_SURGERY_CENTER): Payer: Medicare Other | Admitting: Internal Medicine

## 2019-09-16 ENCOUNTER — Encounter: Payer: Self-pay | Admitting: Internal Medicine

## 2019-09-16 VITALS — BP 126/69 | HR 64 | Temp 96.6°F | Resp 16 | Ht 72.0 in | Wt 215.0 lb

## 2019-09-16 DIAGNOSIS — D128 Benign neoplasm of rectum: Secondary | ICD-10-CM

## 2019-09-16 DIAGNOSIS — D125 Benign neoplasm of sigmoid colon: Secondary | ICD-10-CM | POA: Diagnosis not present

## 2019-09-16 DIAGNOSIS — K219 Gastro-esophageal reflux disease without esophagitis: Secondary | ICD-10-CM | POA: Diagnosis not present

## 2019-09-16 DIAGNOSIS — I10 Essential (primary) hypertension: Secondary | ICD-10-CM | POA: Diagnosis not present

## 2019-09-16 DIAGNOSIS — E669 Obesity, unspecified: Secondary | ICD-10-CM | POA: Diagnosis not present

## 2019-09-16 DIAGNOSIS — D124 Benign neoplasm of descending colon: Secondary | ICD-10-CM

## 2019-09-16 DIAGNOSIS — Z86010 Personal history of colonic polyps: Secondary | ICD-10-CM

## 2019-09-16 DIAGNOSIS — E785 Hyperlipidemia, unspecified: Secondary | ICD-10-CM | POA: Diagnosis not present

## 2019-09-16 DIAGNOSIS — D122 Benign neoplasm of ascending colon: Secondary | ICD-10-CM

## 2019-09-16 MED ORDER — SODIUM CHLORIDE 0.9 % IV SOLN: 500.0000 mL | Freq: Once | INTRAVENOUS | Status: DC

## 2019-09-16 NOTE — Op Note (Signed)
[facility] Patient Name: Jeremy Klein Procedure Date: 09/16/2019 9:00 AM MRN: [id number] Endoscopist: Jerene Bears , MD Age: 79 Referring MD:  Date of Birth: [birth year] Gender: Male Account #: [id number] Procedure:                Colonoscopy Indications:              High risk colon cancer surveillance: Personal                            history of multiple (3 or more) adenomas, Last                            colonoscopy 3 years ago Medicines:                Monitored Anesthesia Care Procedure:                Pre-Anesthesia Assessment:                           - Prior to the procedure, a History and Physical                            was performed, and patient medications and                            allergies were reviewed. The patient's tolerance of                            previous anesthesia was also reviewed. The risks                            and benefits of the procedure and the sedation                            options and risks were discussed with the patient.                            All questions were answered, and informed consent                            was obtained. Prior Anticoagulants: The patient has                            taken no previous anticoagulant or antiplatelet                            agents. ASA Grade Assessment: II - A patient with                            mild systemic disease. After reviewing the risks                            and benefits, the patient was deemed in  satisfactory condition to undergo the procedure.                           After obtaining informed consent, the colonoscope                            was passed under direct vision. Throughout the                            procedure, the patient's blood pressure, pulse, and                            oxygen saturations were monitored continuously. The                            Colonoscope was introduced through the anus and                             advanced to the cecum, identified by appendiceal                            orifice and ileocecal valve. The colonoscopy was                            performed without difficulty. The patient tolerated                            the procedure well. The quality of the bowel                            preparation was good. The ileocecal valve,                            appendiceal orifice, and rectum were photographed. Scope In: 9:01:22 AM Scope Out: 9:16:04 AM Scope Withdrawal Time: 0 hours 10 minutes 22 seconds  Total Procedure Duration: 0 hours 14 minutes 42 seconds  Findings:                 The digital rectal exam was normal.                           Three sessile polyps were found in the sigmoid                            colon (1), descending colon (1) and ascending colon                            (1). The polyps were 2 to 5 mm in size. These                            polyps were removed with a cold snare. Resection                            and retrieval were complete.  Multiple medium-mouthed diverticula were found in                            the sigmoid colon and descending colon.                           Internal hemorrhoids were found during                            retroflexion. The hemorrhoids were small. Complications:            No immediate complications. Estimated Blood Loss:     Estimated blood loss was minimal. Impression:               - Three 2 to 5 mm polyps in the sigmoid colon, in                            the descending colon and in the ascending colon,                            removed with a cold snare. Resected and retrieved.                           - Diverticulosis in the sigmoid colon and in the                            descending colon.                           - Small internal hemorrhoids. Recommendation:           - Patient has a contact number available for                             emergencies. The signs and symptoms of potential                            delayed complications were discussed with the                            patient. Return to normal activities tomorrow.                            Written discharge instructions were provided to the                            patient.                           - Resume previous diet.                           - Continue present medications.                           - Await pathology results.                           -  Repeat colonoscopy for surveillance with be                            discuss based on overall health at next screening                            interval given age > 35 years (at next exam). The                            colonoscopy date will be determined after pathology                            results from today's exam become available for                            review. Jerene Bears, MD 09/16/2019 9:20:07 AM This report has been signed electronically.

## 2019-09-16 NOTE — Progress Notes (Signed)
Report to PACU, RN, vss, BBS= Clear.  

## 2019-09-16 NOTE — Progress Notes (Signed)
Called to room to assist during endoscopic procedure.  Patient ID and intended procedure confirmed with present staff. Received instructions for my participation in the procedure from the performing physician.  

## 2019-09-16 NOTE — Progress Notes (Signed)
Pt's states no medical or surgical changes since previsit or office visit. 

## 2019-09-16 NOTE — Progress Notes (Signed)
Temperature taken by L.C., VS taken by C.W. 

## 2019-09-16 NOTE — Patient Instructions (Signed)
Please read handouts provided. Continue present medications. Await pathology results.   YOU HAD AN ENDOSCOPIC PROCEDURE TODAY AT THE [facility]:   Refer to the procedure report that was given to you for any specific questions about what was found during the examination.  If the procedure report does not answer your questions, please call your gastroenterologist to clarify.  If you requested that your care partner not be given the details of your procedure findings, then the procedure report has been included in a sealed envelope for you to review at your convenience later.  YOU SHOULD EXPECT: Some feelings of bloating in the abdomen. Passage of more gas than usual.  Walking can help get rid of the air that was put into your GI tract during the procedure and reduce the bloating. If you had a lower endoscopy (such as a colonoscopy or flexible sigmoidoscopy) you may notice spotting of blood in your stool or on the toilet paper. If you underwent a bowel prep for your procedure, you may not have a normal bowel movement for a few days.  Please Note:  You might notice some irritation and congestion in your nose or some drainage.  This is from the oxygen used during your procedure.  There is no need for concern and it should clear up in a day or so.  SYMPTOMS TO REPORT IMMEDIATELY:  Following lower endoscopy (colonoscopy or flexible sigmoidoscopy):  Excessive amounts of blood in the stool  Significant tenderness or worsening of abdominal pains  Swelling of the abdomen that is new, acute  Fever of 100F or higher   For urgent or emergent issues, a gastroenterologist can be reached at any hour by calling [phone]. Do not use MyChart messaging for urgent concerns.    DIET:  We do recommend a small meal at first, but then you may proceed to your regular diet.  Drink plenty of fluids but you should avoid alcoholic beverages for 24 hours.  ACTIVITY:  You should plan to take it easy  for the rest of today and you should NOT DRIVE or use heavy machinery until tomorrow (because of the sedation medicines used during the test).    FOLLOW UP: Our staff will call the number listed on your records 48-72 hours following your procedure to check on you and address any questions or concerns that you may have regarding the information given to you following your procedure. If we do not reach you, we will leave a message.  We will attempt to reach you two times.  During this call, we will ask if you have developed any symptoms of COVID 19. If you develop any symptoms (ie: fever, flu-like symptoms, shortness of breath, cough etc.) before then, please call [phone].  If you test positive for Covid 19 in the 2 weeks post procedure, please call and report this information to us.    If any biopsies were taken you will be contacted by phone or by letter within the next 1-3 weeks.  Please call us at [phone] if you have not heard about the biopsies in 3 weeks.    SIGNATURES/CONFIDENTIALITY: You and/or your care partner have signed paperwork which will be entered into your electronic medical record.  These signatures attest to the fact that that the information above on your After Visit Summary has been reviewed and is understood.  Full responsibility of the confidentiality of this discharge information lies with you and/or your care-partner.  

## 2019-09-18 ENCOUNTER — Telehealth: Payer: Self-pay

## 2019-09-18 NOTE — Telephone Encounter (Signed)
  Follow up Call-  Call back number 09/16/2019  Post procedure Call Back phone  # [phone]  Permission to leave phone message Yes  Some recent data might be hidden     Patient questions:  Do you have a fever, pain , or abdominal swelling? No. Pain Score  0 *  Have you tolerated food without any problems? Yes.    Have you been able to return to your normal activities? Yes.    Do you have any questions about your discharge instructions: Diet   No. Medications  No. Follow up visit  No.  Do you have questions or concerns about your Care? No.  Actions: * If pain score is 4 or above: No action needed, pain <4.    1. Have you developed a fever since your procedure? No  2.   Have you had an respiratory symptoms (SOB or cough) since your procedure? No  3.   Have you tested positive for COVID 19 since your procedure? No  4.   Have you had any family members/close contacts diagnosed with the COVID 19 since your procedure?  No   If yes to any of these questions please route to Joylene John, RN and Alphonsa Gin, RN.

## 2019-09-18 NOTE — Telephone Encounter (Signed)
Left message on follow up call. 

## 2019-09-21 ENCOUNTER — Encounter: Payer: Self-pay | Admitting: Internal Medicine

## 2019-11-06 ENCOUNTER — Other Ambulatory Visit: Payer: Self-pay | Admitting: Internal Medicine

## 2019-12-07 ENCOUNTER — Telehealth: Payer: Self-pay | Admitting: Internal Medicine

## 2019-12-07 NOTE — Telephone Encounter (Signed)
Pt stated that PA for pantoprazole will expire 01/26/20.

## 2019-12-08 NOTE — Telephone Encounter (Signed)
Noted  

## 2019-12-31 DIAGNOSIS — H2513 Age-related nuclear cataract, bilateral: Secondary | ICD-10-CM | POA: Diagnosis not present

## 2019-12-31 DIAGNOSIS — H353132 Nonexudative age-related macular degeneration, bilateral, intermediate dry stage: Secondary | ICD-10-CM | POA: Diagnosis not present

## 2019-12-31 DIAGNOSIS — H43813 Vitreous degeneration, bilateral: Secondary | ICD-10-CM | POA: Diagnosis not present

## 2019-12-31 DIAGNOSIS — H04123 Dry eye syndrome of bilateral lacrimal glands: Secondary | ICD-10-CM | POA: Diagnosis not present

## 2020-01-23 ENCOUNTER — Other Ambulatory Visit: Payer: Self-pay | Admitting: Internal Medicine

## 2020-01-31 ENCOUNTER — Other Ambulatory Visit: Payer: Self-pay | Admitting: Internal Medicine

## 2020-02-02 ENCOUNTER — Telehealth: Payer: Self-pay | Admitting: Internal Medicine

## 2020-02-02 NOTE — Telephone Encounter (Signed)
*  STAT* If patient is at the pharmacy, call can be transferred to refill team.   1. Which medications need to be refilled? (please list name of each medication and dose if known)  amLODipine (NORVASC) 5 MG tablet ezetimibe (ZETIA) 10 MG tablet  2. Which pharmacy/location (including street and city if local pharmacy) is medication to be sent to? [place], [address]  3. Do they need a 30 day or 90 day supply? 90 day   Patient advised he is already out of one of the medications, and the other medication he only has 1 day left. Patient is going out of town tomorrow.

## 2020-03-08 ENCOUNTER — Other Ambulatory Visit: Payer: Self-pay | Admitting: Internal Medicine

## 2020-03-14 ENCOUNTER — Other Ambulatory Visit: Payer: Self-pay | Admitting: Internal Medicine

## 2020-03-14 ENCOUNTER — Telehealth: Payer: Self-pay | Admitting: Internal Medicine

## 2020-03-14 NOTE — Telephone Encounter (Signed)
Patient indicates pharmacy told him they need a prior authorization from insurance for his pantoprazole.  Blinda Leatherwood Key: [id number] -  PA Case ID: [id number] Need help? Call us at [phone] Outcome Approved today Your PA request has been approved. Additional information will be provided in the approval communication. (Message 1145) Drug Pantoprazole Sodium 40MG  dr tablets Form [employer] Electronic PA Form [phone] NCPDP) The Prior Authorization request has been approved for Pantoprazole Sodium 40MG  OR TBEC. The authorization is valid from 02/13/2020 through 03/14/2021  I have contacted pharmacist and they indicate that medication has now gone through at $12. Patient advised.

## 2020-03-18 ENCOUNTER — Ambulatory Visit: Payer: Medicare Other | Admitting: Internal Medicine

## 2020-04-01 ENCOUNTER — Other Ambulatory Visit: Payer: Self-pay | Admitting: Internal Medicine

## 2020-04-16 DIAGNOSIS — Z23 Encounter for immunization: Secondary | ICD-10-CM | POA: Diagnosis not present

## 2020-04-30 ENCOUNTER — Other Ambulatory Visit: Payer: Self-pay | Admitting: Internal Medicine

## 2020-05-06 DIAGNOSIS — Z23 Encounter for immunization: Secondary | ICD-10-CM | POA: Diagnosis not present

## 2020-05-12 DIAGNOSIS — M545 Low back pain, unspecified: Secondary | ICD-10-CM | POA: Diagnosis not present

## 2020-05-16 ENCOUNTER — Other Ambulatory Visit: Payer: Self-pay

## 2020-05-16 ENCOUNTER — Ambulatory Visit (INDEPENDENT_AMBULATORY_CARE_PROVIDER_SITE_OTHER): Payer: Medicare Other | Admitting: Internal Medicine

## 2020-05-16 ENCOUNTER — Encounter: Payer: Self-pay | Admitting: Internal Medicine

## 2020-05-16 VITALS — BP 140/82 | HR 60 | Ht 72.0 in | Wt 216.8 lb

## 2020-05-16 DIAGNOSIS — I2510 Atherosclerotic heart disease of native coronary artery without angina pectoris: Secondary | ICD-10-CM | POA: Diagnosis not present

## 2020-05-16 NOTE — Progress Notes (Signed)
Cardiology Office Note   Date:  05/16/2020   ID:  Jeremy Klein, DOB 1940/09/17, MRN [id number]  PCP:  Shon Baton, MD  Cardiologist:   [name], MD   F/U of MV dz  And CAD     History of Present Illness: Jeremy Klein is a 79 y.o. male with a history of MVP  (s/p repair at [facility] in 2003), moderate CAD in 2003 of LAD and , HL   I saw the pt in Aug 2020  Since seen, he denies CP  His breathing is OK   He hurt his back recently  Taking prednisone dose pack Denies dizzines but does say he is unsteady on feet.   Outpatient Medications Prior to Visit  Medication Sig Dispense Refill  . amLODipine (NORVASC) 5 MG tablet TAKE 1 TABLET BY MOUTH DAILY 90 tablet 0  . atorvastatin (LIPITOR) 40 MG tablet Take 1 tablet (40 mg total) by mouth daily. Pt needs to keep upcoming appt in Nov for further refills 60 tablet 0  . ezetimibe (ZETIA) 10 MG tablet TAKE 1 TABLET BY MOUTH DAILY (Patient taking differently: Take 5 mg by mouth daily. ) 90 tablet 2  . Multiple Vitamins-Minerals (ICAPS PO) Take by mouth. 2 by mouth in the am, 2 by mouth in the pm     . pantoprazole (PROTONIX) 40 MG tablet TAKE 1 TABLET BY MOUTH DAILY 30 MINUTES BEFORE BREAKFAST 90 tablet 1  . Saw Palmetto 500 MG CAPS Take 1 capsule by mouth daily.     Marland Kitchen triamterene-hydrochlorothiazide (MAXZIDE-25) 37.5-25 MG tablet Take 1 tablet by mouth daily. Pt needs to keep appt with provider in Nov for further refills 90 tablet 0   No facility-administered medications prior to visit.     Allergies:   Patient has no known allergies.   Past Medical History:  Diagnosis Date  . Cataract   . Colonic polyp    tubular & hyperplasticDr Sharlett Iles  . Coronary artery disease    Dr Harrington Challenger; not significant  . Gastric nodule    with gastric metaplasia  . Heart murmur   . Hyperlipidemia   . Hypertension   . Low back pain    intermittent  . Macular degeneration    Dr Cordelia Pen, [facility]  . Mitral valve prolapse    S/P  repair 2003  . Tubular  adenoma of colon     Past Surgical History:  Procedure Laterality Date  . APPENDECTOMY    . CARDIAC CATHETERIZATION  2003  . COLONOSCOPY  07/18/2016  . COLONOSCOPY W/ POLYPECTOMY  09/2012    Dr Fidela [facility]  . MITRAL VALVE REPAIR  2004   Dr Amador Cunas, [facility]     Social History:  The patient  reports that he quit smoking about 41 years ago. He has never used smokeless tobacco. He reports current alcohol use of about 3.0 standard drinks of alcohol per week. He reports that he does not use drugs.   Family History:  The patient's family history includes Glaucoma in his father; Heart attack (age of onset: 32) in his father; Hypertension (age of onset: 28) in his mother; Prostate cancer (age of onset: 18) in his father; Skin cancer in his father.    ROS:  Please see the history of present illness. All other systems are reviewed and  Negative to the above problem except as noted.    PHYSICAL EXAM: VS:  BP 140/82   Pulse 60   Ht 6' (1.829 m)  Wt 216 lb 12.8 oz (98.3 kg)   BMI 29.40 kg/m   GEN: Well nourished, well developed, in no acute distress  HEENT: normal  Neck: no JVD, carotid bruits Cardiac: RRR; no murmurs,; no LE  edema  Respiratory:  clear to auscultation bilaterally, normal work of breathing GI: soft, nontender, nondistended, + BS  No hepatomegaly  MS: no deformity Moving all extremities   Skin: warm and dry, no rash Neuro:  Strength and sensation are intact Psych: euthymic mood, full affect   EKG:  EKG SR   Occasional PVC  Lipid Panel    Component Value Date/Time   CHOL 149 03/05/2019 0904   TRIG 86 03/05/2019 0904   TRIG 81 06/14/2006 1056   HDL 65 03/05/2019 0904   CHOLHDL 2.3 03/05/2019 0904   CHOLHDL 2.7 11/11/2015 0844   VLDL 23 11/11/2015 0844   LDLCALC 67 03/05/2019 0904      Wt Readings from Last 3 Encounters:  05/16/20 216 lb 12.8 oz (98.3 kg)  09/16/19 215 lb (97.5 kg)  09/02/19 215 lb (97.5 kg)      ASSESSMENT AND PLAN:  1  MV dz   Excellent repair in 2003 at [facility]  No murmur     2   CAD   Mod at cath in 2003   No symptoms of angina  About to have labs at [facility]'s office   2  HL   Last lipids in Nov 2020 LDL 2as 72  HDL 57   Watch carbs   What fried foods, high fat foods     4  HTN  BP is fair   He will send BP readings from home   Hx idizziness in past   Follow for now   5  Unsteadiness   Have given info on [name] as well as [facility]  I can refer\   Keep active    F/U in 1 year   Signed, [name], MD  05/16/2020 2:40 PM    [address], [place], [place]  [address] Phone: [phone]; Fax: [phone]

## 2020-05-16 NOTE — Patient Instructions (Signed)
Medication Instructions:  No changes *If you need a refill on your cardiac medications before your next appointment, please call your pharmacy*   Lab Work: none If you have labs (blood work) drawn today and your tests are completely normal, you will receive your results only by: Marland Kitchen MyChart Message (if you have MyChart) OR . A paper copy in the mail If you have any lab test that is abnormal or we need to change your treatment, we will call you to review the results.   Testing/Procedures: none   Follow-Up: At [facility], you and your health needs are our priority.  As part of our continuing mission to provide you with exceptional heart care, we have created designated Provider Care Teams.  These Care Teams include your primary Cardiologist (physician) and Advanced Practice Providers (APPs -  Physician Assistants and Nurse Practitioners) who all work together to provide you with the care you need, when you need it.   Your next appointment:   12 month(s)  The format for your next appointment:   In Person  Provider:   You may see [name], MD or one of the following Advanced Practice Providers on your designated Care Team:    Richardson Dopp, PA-C  Robbie Lis, [place]   Other Instructions Please record your blood pressures and sent a list to Dr. Harrington Challenger (via My Chart)

## 2020-05-23 DIAGNOSIS — M545 Low back pain, unspecified: Secondary | ICD-10-CM | POA: Diagnosis not present

## 2020-05-23 DIAGNOSIS — M5136 Other intervertebral disc degeneration, lumbar region: Secondary | ICD-10-CM | POA: Diagnosis not present

## 2020-05-31 ENCOUNTER — Other Ambulatory Visit: Payer: Self-pay | Admitting: Internal Medicine

## 2020-06-07 DIAGNOSIS — M545 Low back pain, unspecified: Secondary | ICD-10-CM | POA: Diagnosis not present

## 2020-06-24 DIAGNOSIS — M545 Low back pain, unspecified: Secondary | ICD-10-CM | POA: Diagnosis not present

## 2020-08-02 ENCOUNTER — Emergency Department (HOSPITAL_COMMUNITY): Payer: Medicare Other

## 2020-08-02 ENCOUNTER — Emergency Department (HOSPITAL_COMMUNITY)
Admission: EM | Admit: 2020-08-02 | Discharge: 2020-08-02 | Disposition: A | Discharge status: Home / Self Care | Payer: Medicare Other | Attending: Emergency Medicine | Admitting: Emergency Medicine

## 2020-08-02 DIAGNOSIS — Z5321 Procedure and treatment not carried out due to patient leaving prior to being seen by health care provider: Secondary | ICD-10-CM | POA: Insufficient documentation

## 2020-08-02 DIAGNOSIS — I639 Cerebral infarction, unspecified: Secondary | ICD-10-CM | POA: Diagnosis not present

## 2020-08-02 DIAGNOSIS — G238 Other specified degenerative diseases of basal ganglia: Secondary | ICD-10-CM | POA: Diagnosis not present

## 2020-08-02 DIAGNOSIS — R471 Dysarthria and anarthria: Secondary | ICD-10-CM | POA: Diagnosis not present

## 2020-08-02 LAB — COMPREHENSIVE METABOLIC PANEL
ALT: 14 U/L (ref 0–44)
AST: 13 U/L — ABNORMAL LOW (ref 15–41)
Albumin: 4.1 g/dL (ref 3.5–5.0)
Alkaline Phosphatase: 50 U/L (ref 38–126)
Anion gap: 11 (ref 5–15)
BUN: 14 mg/dL (ref 8–23)
CO2: 23 mmol/L (ref 22–32)
Calcium: 9.4 mg/dL (ref 8.9–10.3)
Chloride: 102 mmol/L (ref 98–111)
Creatinine, Ser: 1.04 mg/dL (ref 0.61–1.24)
GFR, Estimated: >60 mL/min (ref >60)
Glucose, Bld: 109 mg/dL — ABNORMAL HIGH (ref 70–99)
Potassium: 4 mmol/L (ref 3.5–5.1)
Sodium: 136 mmol/L (ref 135–145)
Total Bilirubin: 0.8 mg/dL (ref 0.3–1.2)
Total Protein: 6.7 g/dL (ref 6.5–8.1)

## 2020-08-02 LAB — PROTIME-INR
INR: 1 (ref 0.8–1.2)
Prothrombin Time: 12.8 seconds (ref 11.4–15.2)

## 2020-08-02 LAB — CBC
HCT: 49.2 % (ref 39.0–52.0)
Hemoglobin: 16.3 g/dL (ref 13.0–17.0)
MCH: 30.2 pg (ref 26.0–34.0)
MCHC: 33.1 g/dL (ref 30.0–36.0)
MCV: 91.1 fL (ref 80.0–100.0)
Platelets: 202 10*3/uL (ref 150–400)
RBC: 5.4 MIL/uL (ref 4.22–5.81)
RDW: 13.7 % (ref 11.5–15.5)
WBC: 7.7 10*3/uL (ref 4.0–10.5)
nRBC: 0 % (ref 0.0–0.2)

## 2020-08-02 LAB — DIFFERENTIAL
Abs Immature Granulocytes: 0.03 10*3/uL (ref 0.00–0.07)
Basophils Absolute: 0 10*3/uL (ref 0.0–0.1)
Basophils Relative: 0 %
Eosinophils Absolute: 0 10*3/uL (ref 0.0–0.5)
Eosinophils Relative: 0 %
Immature Granulocytes: 0 %
Lymphocytes Relative: 15 %
Lymphs Abs: 1.1 10*3/uL (ref 0.7–4.0)
Monocytes Absolute: 0.8 10*3/uL (ref 0.1–1.0)
Monocytes Relative: 10 %
Neutro Abs: 5.7 10*3/uL (ref 1.7–7.7)
Neutrophils Relative %: 75 %

## 2020-08-02 LAB — I-STAT CHEM 8, ED
BUN: 18 mg/dL (ref 8–23)
Calcium, Ion: 1.14 mmol/L — ABNORMAL LOW (ref 1.15–1.40)
Chloride: 101 mmol/L (ref 98–111)
Creatinine, Ser: 1 mg/dL (ref 0.61–1.24)
Glucose, Bld: 96 mg/dL (ref 70–99)
HCT: 48 % (ref 39.0–52.0)
Hemoglobin: 16.3 g/dL (ref 13.0–17.0)
Potassium: 4.1 mmol/L (ref 3.5–5.1)
Sodium: 138 mmol/L (ref 135–145)
TCO2: 26 mmol/L (ref 22–32)

## 2020-08-02 LAB — APTT: aPTT: 28 seconds (ref 24–36)

## 2020-08-02 MED ORDER — SODIUM CHLORIDE 0.9% FLUSH: 3.0000 mL | Freq: Once | INTRAVENOUS | Status: DC

## 2020-08-02 NOTE — ED Triage Notes (Signed)
Pt here sent by his MD for possible stroke, pt had  An episode of not being able to get his words out , no symptoms at present , no weakness or facial droop

## 2020-08-04 ENCOUNTER — Other Ambulatory Visit: Payer: Self-pay | Admitting: Internal Medicine

## 2020-08-09 DIAGNOSIS — E663 Overweight: Secondary | ICD-10-CM | POA: Diagnosis not present

## 2020-08-09 DIAGNOSIS — E785 Hyperlipidemia, unspecified: Secondary | ICD-10-CM | POA: Diagnosis not present

## 2020-08-09 DIAGNOSIS — G508 Other disorders of trigeminal nerve: Secondary | ICD-10-CM | POA: Diagnosis not present

## 2020-08-09 DIAGNOSIS — H3530 Unspecified macular degeneration: Secondary | ICD-10-CM | POA: Diagnosis not present

## 2020-08-09 DIAGNOSIS — I119 Hypertensive heart disease without heart failure: Secondary | ICD-10-CM | POA: Diagnosis not present

## 2020-08-09 DIAGNOSIS — K219 Gastro-esophageal reflux disease without esophagitis: Secondary | ICD-10-CM | POA: Diagnosis not present

## 2020-08-09 DIAGNOSIS — K635 Polyp of colon: Secondary | ICD-10-CM | POA: Diagnosis not present

## 2020-08-09 DIAGNOSIS — I2510 Atherosclerotic heart disease of native coronary artery without angina pectoris: Secondary | ICD-10-CM | POA: Diagnosis not present

## 2020-08-09 DIAGNOSIS — I341 Nonrheumatic mitral (valve) prolapse: Secondary | ICD-10-CM | POA: Diagnosis not present

## 2020-08-09 DIAGNOSIS — R200 Anesthesia of skin: Secondary | ICD-10-CM | POA: Diagnosis not present

## 2020-08-09 DIAGNOSIS — R209 Unspecified disturbances of skin sensation: Secondary | ICD-10-CM | POA: Diagnosis not present

## 2020-08-09 DIAGNOSIS — H04129 Dry eye syndrome of unspecified lacrimal gland: Secondary | ICD-10-CM | POA: Diagnosis not present

## 2020-08-10 ENCOUNTER — Other Ambulatory Visit: Payer: Self-pay | Admitting: Internal Medicine

## 2020-08-10 ENCOUNTER — Other Ambulatory Visit (HOSPITAL_COMMUNITY): Payer: Self-pay | Admitting: Internal Medicine

## 2020-08-10 DIAGNOSIS — G508 Other disorders of trigeminal nerve: Secondary | ICD-10-CM

## 2020-08-10 DIAGNOSIS — R200 Anesthesia of skin: Secondary | ICD-10-CM

## 2020-08-11 ENCOUNTER — Other Ambulatory Visit: Payer: Self-pay

## 2020-08-11 ENCOUNTER — Ambulatory Visit
Admission: RE | Admit: 2020-08-11 | Discharge: 2020-08-11 | Disposition: A | Discharge status: Home / Self Care | Payer: Medicare Other | Source: Ambulatory Visit | Attending: Internal Medicine | Admitting: Internal Medicine

## 2020-08-11 DIAGNOSIS — G508 Other disorders of trigeminal nerve: Secondary | ICD-10-CM | POA: Diagnosis not present

## 2020-08-11 DIAGNOSIS — M792 Neuralgia and neuritis, unspecified: Secondary | ICD-10-CM | POA: Diagnosis not present

## 2020-08-11 DIAGNOSIS — G500 Trigeminal neuralgia: Secondary | ICD-10-CM | POA: Diagnosis not present

## 2020-08-11 DIAGNOSIS — I6389 Other cerebral infarction: Secondary | ICD-10-CM | POA: Diagnosis not present

## 2020-08-11 DIAGNOSIS — R200 Anesthesia of skin: Secondary | ICD-10-CM | POA: Insufficient documentation

## 2020-08-11 DIAGNOSIS — G9389 Other specified disorders of brain: Secondary | ICD-10-CM | POA: Diagnosis not present

## 2020-08-11 MED ORDER — GADOBUTROL 1 MMOL/ML IV SOLN
9.0000 mL | Freq: Once | INTRAVENOUS | Status: AC | PRN
  Administered 2020-08-11: 9 mL via INTRAVENOUS

## 2020-08-12 ENCOUNTER — Telehealth: Payer: Self-pay | Admitting: Internal Medicine

## 2020-08-12 DIAGNOSIS — I639 Cerebral infarction, unspecified: Secondary | ICD-10-CM

## 2020-08-12 DIAGNOSIS — I2510 Atherosclerotic heart disease of native coronary artery without angina pectoris: Secondary | ICD-10-CM

## 2020-08-12 NOTE — Telephone Encounter (Signed)
Received note from Dr Virgina Jock. Pt went to ED with stroke like symptoms MRI done which was + Pt on ASA and Plavix WIll need an echo Also GT in office this week   Pt needs LINQ Please work on preauthorization so it can be done    Pt will need f/u in neuro   Dr Virgina Jock may be working on this

## 2020-08-15 ENCOUNTER — Encounter: Payer: Self-pay | Admitting: *Deleted

## 2020-08-15 ENCOUNTER — Telehealth: Payer: Self-pay | Admitting: Internal Medicine

## 2020-08-15 DIAGNOSIS — I639 Cerebral infarction, unspecified: Secondary | ICD-10-CM

## 2020-08-15 NOTE — Telephone Encounter (Signed)
Echo and carotids have been scheduled.  Spoke with Dr. Tanna Furry scheduler who has added patient to see Dr. Lovena Le on 08/17/20 after echo.  Scheduler to make device rep and precert aware of plan for LINQ.  Urgent referral placed to [facility].

## 2020-08-15 NOTE — Addendum Note (Signed)
Addended by: Rodman Key on: 08/15/2020 09:02 AM   Modules accepted: Orders

## 2020-08-15 NOTE — Telephone Encounter (Signed)
Contacted pt on Saturday    Reviewed Hx   Pt followed by [facility] a couple wks ago    Called Dr [facility] office   Concern ? Sinus   Rx ABX     Called back to tell him taht he had difficult finding words/speech Told to go to ED which he did on  08/02/20 Head CT and EKG negative    Sent home    Dr Virgina Jock sent him to have MRI of brain   This was done 08/11/20 MRI showed acute/subacute R cerebellar infarct   8 mm REmote R basal gangica nad bilateral cerebellar lacunar insults  R trigeminal neurtitis  Set up for echo   I wil confirm echo and carotid USN Discussed with EP   Will set up for LINQ (Pt deneis palptatiosn) Will contact neuro for appt (urgent given recent CVA ) Pt says his speech improved   HA finally near gone.  [name] MD

## 2020-08-15 NOTE — Telephone Encounter (Signed)
Erroneous encounter

## 2020-08-17 ENCOUNTER — Ambulatory Visit (INDEPENDENT_AMBULATORY_CARE_PROVIDER_SITE_OTHER): Payer: Medicare Other | Admitting: Internal Medicine

## 2020-08-17 ENCOUNTER — Ambulatory Visit (HOSPITAL_COMMUNITY)
Admission: RE | Admit: 2020-08-17 | Discharge: 2020-08-17 | Disposition: A | Discharge status: Home / Self Care | Payer: Medicare Other | Source: Ambulatory Visit | Attending: Internal Medicine | Admitting: Internal Medicine

## 2020-08-17 ENCOUNTER — Other Ambulatory Visit: Payer: Self-pay

## 2020-08-17 ENCOUNTER — Encounter: Payer: Self-pay | Admitting: Internal Medicine

## 2020-08-17 VITALS — BP 142/82 | HR 76 | Ht 72.0 in | Wt 216.0 lb

## 2020-08-17 DIAGNOSIS — I10 Essential (primary) hypertension: Secondary | ICD-10-CM | POA: Insufficient documentation

## 2020-08-17 DIAGNOSIS — I6389 Other cerebral infarction: Secondary | ICD-10-CM

## 2020-08-17 DIAGNOSIS — I351 Nonrheumatic aortic (valve) insufficiency: Secondary | ICD-10-CM | POA: Insufficient documentation

## 2020-08-17 DIAGNOSIS — I639 Cerebral infarction, unspecified: Secondary | ICD-10-CM

## 2020-08-17 DIAGNOSIS — I2510 Atherosclerotic heart disease of native coronary artery without angina pectoris: Secondary | ICD-10-CM | POA: Insufficient documentation

## 2020-08-17 DIAGNOSIS — E785 Hyperlipidemia, unspecified: Secondary | ICD-10-CM | POA: Diagnosis not present

## 2020-08-17 LAB — ECHOCARDIOGRAM COMPLETE
AV Vena cont: 0.2 cm
Area-P 1/2: 2 cm2
MV VTI: 2.15 cm2
S' Lateral: 3.8 cm

## 2020-08-17 NOTE — Progress Notes (Signed)
NEUROLOGY CONSULTATION NOTE  Jeremy Klein MRN: [id number] DOB: 24-Dec-1940  Referring provider: [name], MD Primary care provider: Shon Baton, MD  Reason for consult:  stroke   Subjective:  Jeremy Klein is a 80 year old right-handed male with CAD, MVP s/p repair, HTN, HLD, and macular degeneration who presents for stroke.  History supplemented by ED note.  On 08/02/2020, he developed a generalized non-throbbing headache.  He never gets headaches.  He briefly reported getting words out.  He had a little runny nose, which isn't unusual but denies any fever, GI or flu-like symptoms .  He was advised to go to the ED by his PCP.  CT head personally reviewed showed no acute abnormalities.  He was discharged.  The headache localized to the right occipital region and slowly resolved.  A couple of days later, he noted numbness along the right side of his nose, upper lip and bridge of his mouth.   No visual disturbance, facial pain, facial weakness, tinnitus, change in hearing, dizziness, ataxia, or facial rash.  His PCP ordered an MRI of the brain with and without contrast performed on 08/11/2020 which was personally reviewed and demonstrated an 8 mm acute/subacute right cerebellar infarct and as well as enhancement of the cisternal segment of the right trigeminal nerve.  Chronic changes such as remote microhemorrhages involving the left vertex and right occipital lobe and remote right basal gangli and bilateral cerebellar lacunar infarcts noted as well.  He was started on Plavix (he stopped ASA a couple of years prior due to GI bleed).  He was maintained on atorvastatin and Zetia.  2D echocardiogram was unremarkable.  He had a loop recorder implanted.  He is scheduled for carotid duplex today. He notices some improvement in the facial numbness.  He reports that about a 3 weeks prior to this episode, he had a bout of dizziness.     PAST MEDICAL HISTORY: Past Medical History:  Diagnosis Date   . Cataract   . Colonic polyp    tubular & hyperplasticDr Sharlett Iles  . Coronary artery disease    Dr Harrington Challenger; not significant  . Gastric nodule    with gastric metaplasia  . Heart murmur   . Hyperlipidemia   . Hypertension   . Low back pain    intermittent  . Macular degeneration    Dr Cordelia Pen, [facility]  . Mitral valve prolapse    S/P  repair 2003  . Tubular adenoma of colon     PAST SURGICAL HISTORY: Past Surgical History:  Procedure Laterality Date  . APPENDECTOMY    . CARDIAC CATHETERIZATION  2003  . COLONOSCOPY  07/18/2016  . COLONOSCOPY W/ POLYPECTOMY  09/2012    Dr [facility]  . MITRAL VALVE REPAIR  2004   Dr Amador Cunas, [place]: Current Outpatient Medications on File Prior to Visit  Medication Sig Dispense Refill  . amLODipine (NORVASC) 5 MG tablet TAKE 1 TABLET BY MOUTH DAILY 90 tablet 3  . atorvastatin (LIPITOR) 40 MG tablet TAKE 1 TABLET(40 MG) BY MOUTH DAILY 90 tablet 3  . ezetimibe (ZETIA) 10 MG tablet TAKE 1 TABLET BY MOUTH DAILY (Patient taking differently: Take 5 mg by mouth daily. ) 90 tablet 2  . Multiple Vitamins-Minerals (ICAPS PO) Take by mouth. 2 by mouth in the am, 2 by mouth in the pm     . pantoprazole (PROTONIX) 40 MG tablet TAKE 1 TABLET BY MOUTH DAILY 30 MINUTES BEFORE BREAKFAST 90 tablet  1  . Saw Palmetto 500 MG CAPS Take 1 capsule by mouth daily.     Marland Kitchen triamterene-hydrochlorothiazide (MAXZIDE-25) 37.5-25 MG tablet Take 1 tablet by mouth daily. Pt needs to keep appt with provider in Nov for further refills 90 tablet 0   No current facility-administered medications on file prior to visit.    ALLERGIES: No Known Allergies  FAMILY HISTORY: Family History  Problem Relation Age of Onset  . Heart attack Father 28  . Prostate cancer Father 26  . Skin cancer Father        Lip  . Glaucoma Father   . Hypertension Mother 28  . Diabetes Neg Hx   . Stroke Neg Hx   . Colon cancer Neg Hx   . Esophageal cancer Neg Hx   . Stomach  cancer Neg Hx   . Rectal cancer Neg Hx   . Colon polyps Neg Hx    SOCIAL HISTORY: Social History   Socioeconomic History  . Marital status: Married    Spouse name: Not on file  . Number of children: Not on file  . Years of education: Not on file  . Highest education level: Not on file  Occupational History  . Occupation: Scientist, physiological: [employer]  Tobacco Use  . Smoking status: Former Smoker    Quit date: 07/16/1978    Years since quitting: 42.1  . Smokeless tobacco: Never Used  . Tobacco comment: smoked 1958-1980, up to 1 ppd for 5 years  Vaping Use  . Vaping Use: Never used  Substance and Sexual Activity  . Alcohol use: Yes    Alcohol/week: 3.0 standard drinks    Types: 3 Shots of liquor per week    Comment: socially  . Drug use: No  . Sexual activity: Not on file  Other Topics Concern  . Not on file  Social History Narrative  . Not on file   Social Determinants of Health   Financial Resource Strain: Not on file  Food Insecurity: Not on file  Transportation Needs: Not on file  Physical Activity: Not on file  Stress: Not on file  Social Connections: Not on file  Intimate Partner Violence: Not on file    Objective:  Blood pressure (!) 144/71, pulse 73, height 6' (1.829 m), weight 214 lb 3.2 oz (97.2 kg), SpO2 93 %. General: No acute distress.  Patient appears well-groomed.   Head:  Normocephalic/atraumatic Eyes:  fundi examined but not visualized Neck: supple, no paraspinal tenderness, full range of motion Back: No paraspinal tenderness Heart: regular rate and rhythm Lungs: Clear to auscultation bilaterally. Vascular: No carotid bruits. Neurological Exam: Mental status: alert and oriented to person, place, and time, recent and remote memory intact, fund of knowledge intact, attention and concentration intact, speech fluent and not dysarthric, language intact. Cranial nerves: CN I: not tested CN II: pupils equal, round and reactive  to light, visual fields intact CN III, IV, VI:  full range of motion, no nystagmus, no ptosis CN V: Decreased sensation to light touch on the right side of his nose, otherwise intact. CN VII: upper and lower face symmetric CN VIII: hearing intact CN IX, X: gag intact, uvula midline CN XI: sternocleidomastoid and trapezius muscles intact CN XII: tongue midline Bulk & Tone: normal, no fasciculations. Motor:  muscle strength 5/5 throughout Sensation:  Pinprick, temperature and vibratory sensation intact. Deep Tendon Reflexes:  2+ throughout,  toes downgoing.   Finger to nose testing:  Without dysmetria.  Heel to shin:  Without dysmetria.   Gait:  Normal station and stride.  Romberg negative.  Assessment/Plan:   1.  Right cerebellar stroke, likely embolic of unknown source 2.  Right trigeminal neuropathy - suspect potential viral etiology, but as it is unusual, I want to repeat MRI to follow up for resolution or changes 3.  Hyperlipidemia 4.  Hypertension  1.  Repeat MRI of brain with and without contrast in 3 months 2.  Secondary stroke prevention as managed by PCP: - Plavix 75mg  daily - Statin therapy.  LDL goal less than 70 - Glycemic control.  Hgb A1c goal less than 7 - Blood pressure control 3.  Follow up 6 months.    Thank you for allowing me to take part in the care of this patient.  Metta Clines, DO  CC:  Shon Baton, MD  [name], MD

## 2020-08-17 NOTE — Progress Notes (Signed)
HPI Adarsh Mundorf is referred today by Dr. Harrington Challenger for evaluation of a cryptogenic stroke. 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 He experienced difficulty speaking a couple of weeks ago, went to the ED and a CT was negative and he went home. He has had an MRI which demonstrated a cerebellar stroke. His echo is essentially normal. He currently denies chest pain, sob, palpitations, HA or edema.  No Known Allergies   Current Outpatient Medications  Medication Sig Dispense Refill  . amLODipine (NORVASC) 5 MG tablet TAKE 1 TABLET BY MOUTH DAILY 90 tablet 3  . atorvastatin (LIPITOR) 40 MG tablet TAKE 1 TABLET(40 MG) BY MOUTH DAILY 90 tablet 3  . clopidogrel (PLAVIX) 75 MG tablet Take 75 mg by mouth daily.    Marland Kitchen ezetimibe (ZETIA) 10 MG tablet TAKE 1 TABLET BY MOUTH DAILY 90 tablet 2  . Multiple Vitamins-Minerals (ICAPS PO) Take by mouth. 2 by mouth in the am, 2 by mouth in the pm    . pantoprazole (PROTONIX) 40 MG tablet TAKE 1 TABLET BY MOUTH DAILY 30 MINUTES BEFORE BREAKFAST 90 tablet 1  . Saw Palmetto 500 MG CAPS Take 1 capsule by mouth daily.    Marland Kitchen triamterene-hydrochlorothiazide (MAXZIDE-25) 37.5-25 MG tablet Take 1 tablet by mouth daily. Pt needs to keep appt with provider in Nov for further refills 90 tablet 0   No current facility-administered medications for this visit.     Past Medical History:  Diagnosis Date  . Cataract   . Colonic polyp    tubular & hyperplasticDr Sharlett Iles  . Coronary artery disease    Dr Harrington Challenger; not significant  . Gastric nodule    with gastric metaplasia  . Heart murmur   . Hyperlipidemia   . Hypertension   . Low back pain    intermittent  . Macular degeneration    Dr Cordelia Pen, [facility]  . Mitral valve prolapse    S/P  repair 2003  . Tubular adenoma of colon     ROS:   All systems reviewed and negative except as noted in the HPI.   Past Surgical History:  Procedure  Laterality Date  . APPENDECTOMY    . CARDIAC CATHETERIZATION  2003  . COLONOSCOPY  07/18/2016  . COLONOSCOPY W/ POLYPECTOMY  09/2012    Dr F[facility]a [facility]  . MITRAL VALVE REPAIR  2004   Dr Amador Cunas, [facility]     Family History  Problem Relation Age of Onset  . Heart attack Father 40  . Prostate cancer Father 38  . Skin cancer Father        Lip  . Glaucoma Father   . Hypertension Mother 36  . Diabetes Neg Hx   . Stroke Neg Hx   . Colon cancer Neg Hx   . Esophageal cancer Neg Hx   . Stomach cancer Neg Hx   . Rectal cancer Neg Hx   . Colon polyps Neg Hx      Social History   Socioeconomic History  . Marital status: Married    Spouse name: Not on file  . Number of children: Not on file  . Years of education: Not on file  . Highest education level: Not on file  Occupational History  . Occupation: Scientist, physiological: [employer]  Tobacco Use  . Smoking status: Former Smoker    Quit date: 07/16/1978  Years since quitting: 42.1  . Smokeless tobacco: Never Used  . Tobacco comment: smoked 1958-1980, up to 1 ppd for 5 years  Vaping Use  . Vaping Use: Never used  Substance and Sexual Activity  . Alcohol use: Yes    Alcohol/week: 3.0 standard drinks    Types: 3 Shots of liquor per week    Comment: socially  . Drug use: No  . Sexual activity: Not on file  Other Topics Concern  . Not on file  Social History Narrative  . Not on file   Social Determinants of Health   Financial Resource Strain: Not on file  Food Insecurity: Not on file  Transportation Needs: Not on file  Physical Activity: Not on file  Stress: Not on file  Social Connections: Not on file  Intimate Partner Violence: Not on file     BP (!) 142/82   Pulse 76   Ht 6' (1.829 m)   Wt 216 lb (98 kg)   SpO2 96%   BMI 29.29 kg/m   Physical Exam:  Well appearing NAD HEENT: Unremarkable Neck:  No JVD, no thyromegally Lymphatics:  No adenopathy Back:  No CVA  tenderness Lungs:  Clear with no wheezes HEART:  Regular rate rhythm, no murmurs, no rubs, no clicks Abd:  soft, positive bowel sounds, no organomegally, no rebound, no guarding Ext:  2 plus pulses, no edema, no cyanosis, no clubbing Skin:  No rashes no nodules Neuro:  CN II through XII intact, motor grossly intact  EKG - reviewed  Assess/Plan: 1. Cryptogenic stroke - the etiology is unknown. I have recommended insertion of an ILR and he is willing to proceed. 2. Mitral valve repair - he is s/p mitral valve repair almost 20 years ago. He has normal mitral valve function.    EP Procedure Note Preoperative diagnosis: cryptogenic stroke  Postoperative diagnosis: cryptogenic stroke  Procedure performed: ILR insertion  Description of the procedure: after informed consent was obtained, the patient was prepped and draped in a sterile fashion. 10 cc of lidocaine was infiltrated into the left pectoral region. A one cm stab incision was carried out. The medtronic ILR, [id number], was inserted. The R waves measured 0.2 mV. Steri-strips and a bandage were placed over the incision and the patient recovered in the usual manner.   Complications: none  Conclusion: successful insertion of a Medtronic ILR in a patient with a cryptogenic stroke.  Carleene Overlie Denine Brotz,MD

## 2020-08-17 NOTE — Progress Notes (Signed)
Echocardiogram 2D Echocardiogram has been performed.  Oneal Deputy Koltin Wehmeyer 08/17/2020, 9:45 AM

## 2020-08-17 NOTE — Patient Instructions (Addendum)
Medication Instructions:   DO NOT TAKE YOUR PLAVIX TOMORROW 08/18/20 OR THE NEXT DAY 08/19/20.  You can restart your Plavix on 08/20/20.  Labwork: None ordered.  Testing/Procedures: None ordered.  Follow-Up:  Your physician wants you to follow-up in: as needed with Dr. Lovena Le.     Implantable Loop Recorder Placement, Care After This sheet gives you information about how to care for yourself after your procedure. Your health care provider may also give you more specific instructions. If you have problems or questions, contact your health care provider. What can I expect after the procedure? After the procedure, it is common to have:  Soreness or discomfort near the incision.  Some swelling or bruising near the incision.  Follow these instructions at home: Incision care  1.  Leave your outer dressing on for 72 hours.  After 72 hours you can remove your outer dressing and shower. 2. Leave adhesive strips in place. These skin closures may need to stay in place for 1-2 weeks. If adhesive strip edges start to loosen and curl up, you may trim the loose edges.  You may remove the strips if they have not fallen off after 2 weeks. 3. Check your incision area every day for signs of infection. Check for: a. Redness, swelling, or pain. b. Fluid or blood. c. Warmth. d. Pus or a bad smell. 4. Do not take baths, swim, or use a hot tub until your incision is completely healed. 5. If your wound site starts to bleed apply pressure.      If you have any questions/concerns please call the device clinic at [phone].  Activity  Return to your normal activities.  General instructions  Follow instructions from your health care provider about how to manage your implantable loop recorder and transmit the information. Learn how to activate a recording if this is necessary for your type of device.  Do not go through a metal detection gate, and do not let someone hold a metal detector over your chest.  Show your ID card.  Do not have an MRI unless you check with your health care provider first.  Take over-the-counter and prescription medicines only as told by your health care provider.  Keep all follow-up visits as told by your health care provider. This is important. Contact a health care provider if:  You have redness, swelling, or pain around your incision.  You have a fever.  You have pain that is not relieved by your pain medicine.  You have triggered your device because of fainting (syncope) or because of a heartbeat that feels like it is racing, slow, fluttering, or skipping (palpitations). Get help right away if you have:  Chest pain.  Difficulty breathing. Summary  After the procedure, it is common to have soreness or discomfort near the incision.  Change your dressing as told by your health care provider.  Follow instructions from your health care provider about how to manage your implantable loop recorder and transmit the information.  Keep all follow-up visits as told by your health care provider. This is important. This information is not intended to replace advice given to you by your health care provider. Make sure you discuss any questions you have with your health care provider. Document Released: 06/13/2015 Document Revised: 08/17/2017 Document Reviewed: 08/17/2017 Elsevier Patient Education  2020 [employer].

## 2020-08-18 ENCOUNTER — Ambulatory Visit (INDEPENDENT_AMBULATORY_CARE_PROVIDER_SITE_OTHER): Payer: Medicare Other | Admitting: Neurology

## 2020-08-18 ENCOUNTER — Ambulatory Visit (HOSPITAL_COMMUNITY)
Admission: RE | Admit: 2020-08-18 | Discharge: 2020-08-18 | Disposition: A | Discharge status: Home / Self Care | Payer: Medicare Other | Source: Ambulatory Visit | Attending: Cardiovascular Disease | Admitting: Cardiovascular Disease

## 2020-08-18 ENCOUNTER — Encounter: Payer: Self-pay | Admitting: Neurology

## 2020-08-18 VITALS — BP 144/71 | HR 73 | Ht 72.0 in | Wt 214.2 lb

## 2020-08-18 DIAGNOSIS — G509 Disorder of trigeminal nerve, unspecified: Secondary | ICD-10-CM | POA: Diagnosis not present

## 2020-08-18 DIAGNOSIS — I2510 Atherosclerotic heart disease of native coronary artery without angina pectoris: Secondary | ICD-10-CM | POA: Insufficient documentation

## 2020-08-18 DIAGNOSIS — I6389 Other cerebral infarction: Secondary | ICD-10-CM

## 2020-08-18 DIAGNOSIS — E782 Mixed hyperlipidemia: Secondary | ICD-10-CM

## 2020-08-18 DIAGNOSIS — I10 Essential (primary) hypertension: Secondary | ICD-10-CM | POA: Diagnosis not present

## 2020-08-18 DIAGNOSIS — G464 Cerebellar stroke syndrome: Secondary | ICD-10-CM

## 2020-08-18 DIAGNOSIS — I639 Cerebral infarction, unspecified: Secondary | ICD-10-CM

## 2020-08-18 NOTE — Patient Instructions (Addendum)
1.  Continue Plavix, atorvastatin, Zetia and other medications 2.  I want to repeat MRI of brain with and without contrast in 3 months to follow up on the trigeminal nerve (which is the cause of the facial numbness). We have sent a referral to [facility] for your MRI and they will call you directly to schedule your appointment. They are located at [place]. If you need to contact them directly please call [phone].  3.  Follow up with me in 6 months.

## 2020-08-25 ENCOUNTER — Telehealth: Payer: Self-pay | Admitting: Internal Medicine

## 2020-08-25 NOTE — Telephone Encounter (Signed)
Patient called and said that he had a loop recorder put in and had to download an APP for it to work. He says that the APP says that wait 1-2 weeks before it works. Wants to know if that is right or what

## 2020-08-25 NOTE — Telephone Encounter (Signed)
Returning patients phone call. States his app for his ILR 2 is not working, stating he is not registered. Patient is being added into [employer] and [facility]. Advised patient the system will take 24-72 hours to update and we will call him back to make sure it is  Fully set up. Advised patient to contact if further questions arise. Verbalized understanding.

## 2020-08-26 NOTE — Telephone Encounter (Signed)
The app is working properly. We did receive a transmission. I called to let the patient know.

## 2020-09-05 ENCOUNTER — Other Ambulatory Visit: Payer: Self-pay | Admitting: Internal Medicine

## 2020-09-14 ENCOUNTER — Other Ambulatory Visit: Payer: Self-pay | Admitting: Internal Medicine

## 2020-09-23 DIAGNOSIS — E785 Hyperlipidemia, unspecified: Secondary | ICD-10-CM | POA: Diagnosis not present

## 2020-09-23 DIAGNOSIS — Z125 Encounter for screening for malignant neoplasm of prostate: Secondary | ICD-10-CM | POA: Diagnosis not present

## 2020-09-27 ENCOUNTER — Ambulatory Visit (INDEPENDENT_AMBULATORY_CARE_PROVIDER_SITE_OTHER): Payer: Medicare Other

## 2020-09-27 DIAGNOSIS — I341 Nonrheumatic mitral (valve) prolapse: Secondary | ICD-10-CM | POA: Diagnosis not present

## 2020-09-27 DIAGNOSIS — I639 Cerebral infarction, unspecified: Secondary | ICD-10-CM | POA: Diagnosis not present

## 2020-09-27 DIAGNOSIS — E663 Overweight: Secondary | ICD-10-CM | POA: Diagnosis not present

## 2020-09-27 DIAGNOSIS — R200 Anesthesia of skin: Secondary | ICD-10-CM | POA: Diagnosis not present

## 2020-09-27 DIAGNOSIS — E785 Hyperlipidemia, unspecified: Secondary | ICD-10-CM | POA: Diagnosis not present

## 2020-09-27 DIAGNOSIS — I2510 Atherosclerotic heart disease of native coronary artery without angina pectoris: Secondary | ICD-10-CM | POA: Diagnosis not present

## 2020-09-27 DIAGNOSIS — G508 Other disorders of trigeminal nerve: Secondary | ICD-10-CM | POA: Diagnosis not present

## 2020-09-27 DIAGNOSIS — Z0000 Encounter for general adult medical examination without abnormal findings: Secondary | ICD-10-CM | POA: Diagnosis not present

## 2020-09-27 DIAGNOSIS — Z95818 Presence of other cardiac implants and grafts: Secondary | ICD-10-CM | POA: Diagnosis not present

## 2020-09-27 DIAGNOSIS — R361 Hematospermia: Secondary | ICD-10-CM | POA: Diagnosis not present

## 2020-09-27 DIAGNOSIS — I6990 Unspecified sequelae of unspecified cerebrovascular disease: Secondary | ICD-10-CM | POA: Diagnosis not present

## 2020-09-27 DIAGNOSIS — I119 Hypertensive heart disease without heart failure: Secondary | ICD-10-CM | POA: Diagnosis not present

## 2020-09-27 DIAGNOSIS — R82998 Other abnormal findings in urine: Secondary | ICD-10-CM | POA: Diagnosis not present

## 2020-09-27 DIAGNOSIS — R399 Unspecified symptoms and signs involving the genitourinary system: Secondary | ICD-10-CM | POA: Diagnosis not present

## 2020-09-27 DIAGNOSIS — Z1212 Encounter for screening for malignant neoplasm of rectum: Secondary | ICD-10-CM | POA: Diagnosis not present

## 2020-09-28 LAB — CUP PACEART REMOTE DEVICE CHECK
Date Time Interrogation Session: 20220315181804
Implantable Pulse Generator Implant Date: 20220202

## 2020-10-01 ENCOUNTER — Other Ambulatory Visit: Payer: Self-pay | Admitting: Internal Medicine

## 2020-10-05 NOTE — Progress Notes (Signed)
Carelink Summary Report / Loop Recorder 

## 2020-10-17 ENCOUNTER — Telehealth: Payer: Self-pay | Admitting: Neurology

## 2020-10-17 NOTE — Telephone Encounter (Signed)
Patient states that Dr Tomi Likens wants him to have an MRI done but he isn't sure when Dr Tomi Likens wanted him to have it done prior to his next follow up appt in August. Please call.

## 2020-10-17 NOTE — Telephone Encounter (Signed)
Pt called an informed that Dr Tomi Likens wanted the MRI done sometime in May. Pt stated that he would call and get it scheduled ,

## 2020-10-31 ENCOUNTER — Ambulatory Visit (INDEPENDENT_AMBULATORY_CARE_PROVIDER_SITE_OTHER): Payer: Medicare Other

## 2020-10-31 DIAGNOSIS — I639 Cerebral infarction, unspecified: Secondary | ICD-10-CM | POA: Diagnosis not present

## 2020-11-01 LAB — CUP PACEART REMOTE DEVICE CHECK
Date Time Interrogation Session: 20220417181453
Implantable Pulse Generator Implant Date: 20220202

## 2020-11-16 NOTE — Progress Notes (Signed)
Carelink Summary Report / Loop Recorder 

## 2020-11-22 ENCOUNTER — Ambulatory Visit
Admission: RE | Admit: 2020-11-22 | Discharge: 2020-11-22 | Disposition: A | Discharge status: Home / Self Care | Payer: Medicare Other | Source: Ambulatory Visit | Attending: Neurology | Admitting: Neurology

## 2020-11-22 ENCOUNTER — Other Ambulatory Visit: Payer: Self-pay

## 2020-11-22 DIAGNOSIS — I639 Cerebral infarction, unspecified: Secondary | ICD-10-CM

## 2020-11-22 DIAGNOSIS — J341 Cyst and mucocele of nose and nasal sinus: Secondary | ICD-10-CM | POA: Diagnosis not present

## 2020-11-22 DIAGNOSIS — J3489 Other specified disorders of nose and nasal sinuses: Secondary | ICD-10-CM | POA: Diagnosis not present

## 2020-11-22 DIAGNOSIS — I6389 Other cerebral infarction: Secondary | ICD-10-CM | POA: Diagnosis not present

## 2020-11-22 DIAGNOSIS — G509 Disorder of trigeminal nerve, unspecified: Secondary | ICD-10-CM

## 2020-11-22 DIAGNOSIS — J321 Chronic frontal sinusitis: Secondary | ICD-10-CM | POA: Diagnosis not present

## 2020-11-22 MED ORDER — GADOBENATE DIMEGLUMINE 529 MG/ML IV SOLN
20.0000 mL | Freq: Once | INTRAVENOUS | Status: AC | PRN
  Administered 2020-11-22: 20 mL via INTRAVENOUS

## 2020-11-24 ENCOUNTER — Other Ambulatory Visit (INDEPENDENT_AMBULATORY_CARE_PROVIDER_SITE_OTHER): Payer: Medicare Other

## 2020-11-24 ENCOUNTER — Telehealth: Payer: Self-pay

## 2020-11-24 ENCOUNTER — Other Ambulatory Visit: Payer: Self-pay

## 2020-11-24 DIAGNOSIS — E538 Deficiency of other specified B group vitamins: Secondary | ICD-10-CM

## 2020-11-24 DIAGNOSIS — G5613 Other lesions of median nerve, bilateral upper limbs: Secondary | ICD-10-CM

## 2020-11-24 DIAGNOSIS — G509 Disorder of trigeminal nerve, unspecified: Secondary | ICD-10-CM | POA: Diagnosis not present

## 2020-11-24 LAB — C-REACTIVE PROTEIN: CRP: <1 mg/dL (ref 0.5–20.0)

## 2020-11-24 LAB — SEDIMENTATION RATE: Sed Rate: 11 mm/hr (ref 0–20)

## 2020-11-24 LAB — TSH: TSH: 1.35 u[IU]/mL (ref 0.35–4.50)

## 2020-11-24 LAB — VITAMIN B12: Vitamin B-12: 205 pg/mL — ABNORMAL LOW (ref 211–911)

## 2020-11-24 NOTE — Telephone Encounter (Signed)
MRI still shows inflammation of the trigeminal nerve. I still think it is from a viral infection, but I would like to check blood work for alternative causes of trigeminal neuropathy - ANA, sed rate, CRP, B12, TSH, ACE, SSA/SSB antibodies, Lyme  Orders added.

## 2020-11-25 LAB — ENA+DNA/DS+SJORGEN'S
ENA RNP Ab: 0.2 AI (ref 0.0–0.9)
ENA SM Ab Ser-aCnc: <0.2 AI (ref 0.0–0.9)
ENA SSA (RO) Ab: <0.2 AI (ref 0.0–0.9)
ENA SSB (LA) Ab: <0.2 AI (ref 0.0–0.9)
dsDNA Ab: 11 IU/mL — ABNORMAL HIGH (ref 0–9)

## 2020-11-25 LAB — ANGIOTENSIN CONVERTING ENZYME: Angiotensin-Converting Enzyme: 22 U/L (ref 9–67)

## 2020-11-25 LAB — ANA W/REFLEX: Anti Nuclear Antibody (ANA): POSITIVE — AB

## 2020-11-25 LAB — LYME DISEASE SEROLOGY W/REFLEX: Lyme Total Antibody EIA: NEGATIVE

## 2020-11-25 LAB — SJOGREN'S SYNDROME ANTIBODS(SSA + SSB)
SSA (Ro) (ENA) Antibody, IgG: <1 AI
SSB (La) (ENA) Antibody, IgG: <1 AI

## 2020-11-30 NOTE — Progress Notes (Signed)
LMOVM to call the office back.

## 2020-12-01 ENCOUNTER — Telehealth: Payer: Self-pay | Admitting: Neurology

## 2020-12-01 NOTE — Telephone Encounter (Signed)
Patient called in to get lab results

## 2020-12-05 ENCOUNTER — Ambulatory Visit (INDEPENDENT_AMBULATORY_CARE_PROVIDER_SITE_OTHER): Payer: Medicare Other

## 2020-12-05 DIAGNOSIS — I639 Cerebral infarction, unspecified: Secondary | ICD-10-CM

## 2020-12-07 LAB — CUP PACEART REMOTE DEVICE CHECK
Date Time Interrogation Session: 20220520181740
Implantable Pulse Generator Implant Date: 20220202

## 2020-12-19 DIAGNOSIS — M25511 Pain in right shoulder: Secondary | ICD-10-CM | POA: Diagnosis not present

## 2020-12-19 DIAGNOSIS — Z95818 Presence of other cardiac implants and grafts: Secondary | ICD-10-CM | POA: Diagnosis not present

## 2020-12-19 DIAGNOSIS — J3489 Other specified disorders of nose and nasal sinuses: Secondary | ICD-10-CM | POA: Diagnosis not present

## 2020-12-19 DIAGNOSIS — Z20828 Contact with and (suspected) exposure to other viral communicable diseases: Secondary | ICD-10-CM | POA: Diagnosis not present

## 2020-12-19 DIAGNOSIS — R059 Cough, unspecified: Secondary | ICD-10-CM | POA: Diagnosis not present

## 2020-12-19 DIAGNOSIS — Z1152 Encounter for screening for COVID-19: Secondary | ICD-10-CM | POA: Diagnosis not present

## 2020-12-19 DIAGNOSIS — J189 Pneumonia, unspecified organism: Secondary | ICD-10-CM | POA: Diagnosis not present

## 2020-12-19 DIAGNOSIS — G8929 Other chronic pain: Secondary | ICD-10-CM | POA: Diagnosis not present

## 2020-12-26 NOTE — Progress Notes (Signed)
Carelink Summary Report / Loop Recorder 

## 2021-01-05 LAB — CUP PACEART REMOTE DEVICE CHECK
Date Time Interrogation Session: 20220622181334
Implantable Pulse Generator Implant Date: 20220202

## 2021-01-09 ENCOUNTER — Ambulatory Visit (INDEPENDENT_AMBULATORY_CARE_PROVIDER_SITE_OTHER): Payer: Medicare Other

## 2021-01-09 DIAGNOSIS — R55 Syncope and collapse: Secondary | ICD-10-CM

## 2021-01-12 DIAGNOSIS — M25511 Pain in right shoulder: Secondary | ICD-10-CM | POA: Diagnosis not present

## 2021-01-12 DIAGNOSIS — H43813 Vitreous degeneration, bilateral: Secondary | ICD-10-CM | POA: Diagnosis not present

## 2021-01-12 DIAGNOSIS — H353132 Nonexudative age-related macular degeneration, bilateral, intermediate dry stage: Secondary | ICD-10-CM | POA: Diagnosis not present

## 2021-01-12 DIAGNOSIS — H04123 Dry eye syndrome of bilateral lacrimal glands: Secondary | ICD-10-CM | POA: Diagnosis not present

## 2021-01-12 DIAGNOSIS — M7541 Impingement syndrome of right shoulder: Secondary | ICD-10-CM | POA: Diagnosis not present

## 2021-01-12 DIAGNOSIS — H2513 Age-related nuclear cataract, bilateral: Secondary | ICD-10-CM | POA: Diagnosis not present

## 2021-01-25 DIAGNOSIS — L570 Actinic keratosis: Secondary | ICD-10-CM | POA: Diagnosis not present

## 2021-01-25 DIAGNOSIS — D225 Melanocytic nevi of trunk: Secondary | ICD-10-CM | POA: Diagnosis not present

## 2021-01-25 DIAGNOSIS — L812 Freckles: Secondary | ICD-10-CM | POA: Diagnosis not present

## 2021-01-25 DIAGNOSIS — L821 Other seborrheic keratosis: Secondary | ICD-10-CM | POA: Diagnosis not present

## 2021-01-30 NOTE — Progress Notes (Signed)
Carelink Summary Report / Loop Recorder 

## 2021-02-07 LAB — CUP PACEART REMOTE DEVICE CHECK
Date Time Interrogation Session: 20220725181655
Implantable Pulse Generator Implant Date: 20220202

## 2021-02-08 ENCOUNTER — Ambulatory Visit (INDEPENDENT_AMBULATORY_CARE_PROVIDER_SITE_OTHER): Payer: Medicare Other

## 2021-02-08 DIAGNOSIS — R55 Syncope and collapse: Secondary | ICD-10-CM | POA: Diagnosis not present

## 2021-02-14 NOTE — Progress Notes (Signed)
NEUROLOGY FOLLOW UP OFFICE NOTE  Jeremy JAGIELLO [id number]  Assessment/Plan:   Right cerebellar stroke, likely embolic of unknown source Right trigeminal neuritis, resolved - suspect viral etiology Hypertension Hyperlipidemia  Secondary stroke prevention as managed by PCP: - Plavix '75mg'$  daily - Statin therapy.  LDL goal less than 70 - Glycemic control.  Hgb A1c goal less than 7 - Blood pressure control Follow up 6 months.   Subjective:  Jeremy Klein is a 80 year old right-handed male with CAD, MVP s/p repair, HTN, HLD, and macular degeneration who follows up for trigeminal anesthesia and cerebellar stroke  UPDATE: He had an MRI of brain with and without contrast on 11/22/2020 to follow up on facial numbness, which was personally reviewed which demonstrated persistent enhancement of the right trigeminal nerve.  Serum labs on 11/24/2020 included positive ANA with mildly elevated ds-DNA ab 11 but negative RNP ab, SM ab, SSA/SSB abs, and Scl-70 abs; sed rate 11, negative CRP, ACE 22, negative Lyme ab, TSH 1.35, and low B12 205.  He was advised to start OTC B12 1065mg daily.  Facial numbness has resolved.  No dizziness or ataxia.    HISTORY: On 08/02/2020, he developed a generalized non-throbbing headache.  He never gets headaches.  He briefly reported getting words out.  He had a little runny nose, which isn't unusual but denies any fever, GI or flu-like symptoms .  He was advised to go to the ED by his PCP.  CT head personally reviewed showed no acute abnormalities.  He was discharged.  The headache localized to the right occipital region and slowly resolved.  A couple of days later, he noted numbness along the right side of his nose, upper lip and bridge of his mouth.   No visual disturbance, facial pain, facial weakness, tinnitus, change in hearing, dizziness, ataxia, or facial rash.  His PCP ordered an MRI of the brain with and without contrast performed on 08/11/2020 which was  personally reviewed and demonstrated an 8 mm acute/subacute right cerebellar infarct and as well as enhancement of the cisternal segment of the right trigeminal nerve.  Chronic changes such as remote microhemorrhages involving the left vertex and right occipital lobe and remote right basal gangli and bilateral cerebellar lacunar infarcts noted as well.  He was started on Plavix (he stopped ASA a couple of years prior due to GI bleed).  He was maintained on atorvastatin and Zetia.  2D echocardiogram was unremarkable.  He had a loop recorder implanted.  He is scheduled for carotid duplex today. He notices some improvement in the facial numbness.  He reports that about a 3 weeks prior to this episode, he had a bout of dizziness.    PAST MEDICAL HISTORY: Past Medical History:  Diagnosis Date   Cataract    Colonic polyp    tubular & hyperplasticDr PSharlett Iles  Coronary artery disease    Dr RHarrington Challenger not significant   Gastric nodule    with gastric metaplasia   Heart murmur    Hyperlipidemia    Hypertension    Low back pain    intermittent   [facility]  Mit[facility] valve prolapse    S/P  repair 2003   Tubular adenoma of colon     MEDICATIONS: Current Outpatient Medications on File Prior to Visit  Medication Sig Dispense Refill   amLODipine (NORVASC) 5 MG tablet TAKE 1 TABLET BY MOUTH DAILY 90 tablet 3   atorvastatin (  LIPITOR) 40 MG tablet TAKE 1 TABLET(40 MG) BY MOUTH DAILY 90 tablet 3   clopidogrel (PLAVIX) 75 MG tablet Take 75 mg by mouth daily.     ezetimibe (ZETIA) 10 MG tablet TAKE 1 TABLET BY MOUTH DAILY 90 tablet 2   Multiple Vitamins-Minerals (ICAPS PO) Take by mouth. 2 by mouth in the am, 2 by mouth in the pm     pantoprazole (PROTONIX) 40 MG tablet TAKE 1 TABLET BY MOUTH DAILY 30 MINUTES BEFORE BREAKFAST 90 tablet 1   Saw Palmetto 500 MG CAPS Take 1 capsule by mouth daily.     triamterene-hydrochlorothiazide (MAXZIDE-25) 37.5-25 MG tablet TAKE 1 TABLET BY  MOUTH DAILY 90 tablet 2   No current facility-administered medications on file prior to visit.    ALLERGIES: No Known Allergies  FAMILY HISTORY: Family History  Problem Relation Age of Onset   Heart attack Father 13   Prostate cancer Father 55   Skin cancer Father        Lip   Glaucoma Father    Hypertension Mother 83   Diabetes Neg Hx    Stroke Neg Hx    Colon cancer Neg Hx    Esophageal cancer Neg Hx    Stomach cancer Neg Hx    Rectal cancer Neg Hx    Colon polyps Neg Hx       Objective:  Blood pressure (!) 145/71, pulse 73, height 6' (1.829 m), weight 207 lb 9.6 oz (94.2 kg), SpO2 97 %. General: No acute distress.  Patient appears well-groomed.   Head:  Normocephalic/atraumatic Eyes:  Fundi examined but not visualized Neck: supple, no paraspinal tenderness, full range of motion Heart:  Regular rate and rhythm Lungs:  Clear to auscultation bilaterally Back: No paraspinal tenderness Neurological Exam: alert and oriented to person, place, and time.  Speech fluent and not dysarthric, language intact.  CN II-XII intact. Bulk and tone normal, muscle strength 5/5 throughout.  Sensation to light touch intact.  Deep tendon reflexes 2+ throughout, toes downgoing.  Finger to nose testing intact.  Gait normal, Romberg negative.   Metta Clines, DO  CC: Shon Baton, MD

## 2021-02-15 ENCOUNTER — Ambulatory Visit (INDEPENDENT_AMBULATORY_CARE_PROVIDER_SITE_OTHER): Payer: Medicare Other | Admitting: Neurology

## 2021-02-15 ENCOUNTER — Other Ambulatory Visit: Payer: Self-pay

## 2021-02-15 VITALS — BP 145/71 | HR 73 | Ht 72.0 in | Wt 207.6 lb

## 2021-02-15 DIAGNOSIS — E782 Mixed hyperlipidemia: Secondary | ICD-10-CM

## 2021-02-15 DIAGNOSIS — I639 Cerebral infarction, unspecified: Secondary | ICD-10-CM | POA: Diagnosis not present

## 2021-02-15 DIAGNOSIS — G508 Other disorders of trigeminal nerve: Secondary | ICD-10-CM | POA: Diagnosis not present

## 2021-02-15 DIAGNOSIS — I10 Essential (primary) hypertension: Secondary | ICD-10-CM

## 2021-02-15 NOTE — Patient Instructions (Signed)
Continue Plavix '75mg'$  daily Continue statin  Blood pressure control Follow up 6 months.

## 2021-02-16 ENCOUNTER — Encounter: Payer: Self-pay | Admitting: Neurology

## 2021-02-21 ENCOUNTER — Ambulatory Visit: Payer: Medicare Other | Admitting: Neurology

## 2021-03-06 NOTE — Progress Notes (Signed)
Carelink Summary Report / Loop Recorder 

## 2021-03-13 ENCOUNTER — Ambulatory Visit (INDEPENDENT_AMBULATORY_CARE_PROVIDER_SITE_OTHER): Payer: Medicare Other

## 2021-03-13 DIAGNOSIS — I639 Cerebral infarction, unspecified: Secondary | ICD-10-CM

## 2021-03-13 LAB — CUP PACEART REMOTE DEVICE CHECK
Date Time Interrogation Session: 20220827181824
Implantable Pulse Generator Implant Date: 20220202

## 2021-03-14 ENCOUNTER — Other Ambulatory Visit: Payer: Self-pay | Admitting: Internal Medicine

## 2021-03-24 NOTE — Progress Notes (Signed)
Carelink Summary Report / Loop Recorder 

## 2021-03-30 DIAGNOSIS — K219 Gastro-esophageal reflux disease without esophagitis: Secondary | ICD-10-CM | POA: Diagnosis not present

## 2021-03-30 DIAGNOSIS — Z95818 Presence of other cardiac implants and grafts: Secondary | ICD-10-CM | POA: Diagnosis not present

## 2021-03-30 DIAGNOSIS — I6990 Unspecified sequelae of unspecified cerebrovascular disease: Secondary | ICD-10-CM | POA: Diagnosis not present

## 2021-03-30 DIAGNOSIS — K635 Polyp of colon: Secondary | ICD-10-CM | POA: Diagnosis not present

## 2021-03-30 DIAGNOSIS — E785 Hyperlipidemia, unspecified: Secondary | ICD-10-CM | POA: Diagnosis not present

## 2021-03-30 DIAGNOSIS — Z23 Encounter for immunization: Secondary | ICD-10-CM | POA: Diagnosis not present

## 2021-03-30 DIAGNOSIS — E663 Overweight: Secondary | ICD-10-CM | POA: Diagnosis not present

## 2021-03-30 DIAGNOSIS — R399 Unspecified symptoms and signs involving the genitourinary system: Secondary | ICD-10-CM | POA: Diagnosis not present

## 2021-03-30 DIAGNOSIS — R361 Hematospermia: Secondary | ICD-10-CM | POA: Diagnosis not present

## 2021-03-30 DIAGNOSIS — E538 Deficiency of other specified B group vitamins: Secondary | ICD-10-CM | POA: Diagnosis not present

## 2021-03-30 DIAGNOSIS — I119 Hypertensive heart disease without heart failure: Secondary | ICD-10-CM | POA: Diagnosis not present

## 2021-03-30 DIAGNOSIS — I2510 Atherosclerotic heart disease of native coronary artery without angina pectoris: Secondary | ICD-10-CM | POA: Diagnosis not present

## 2021-04-17 ENCOUNTER — Ambulatory Visit (INDEPENDENT_AMBULATORY_CARE_PROVIDER_SITE_OTHER): Payer: Medicare Other

## 2021-04-17 DIAGNOSIS — I639 Cerebral infarction, unspecified: Secondary | ICD-10-CM | POA: Diagnosis not present

## 2021-04-19 LAB — CUP PACEART REMOTE DEVICE CHECK
Date Time Interrogation Session: 20220929181836
Implantable Pulse Generator Implant Date: 20220202

## 2021-04-25 NOTE — Progress Notes (Signed)
Carelink Summary Report / Loop Recorder 

## 2021-04-26 ENCOUNTER — Encounter: Payer: Self-pay | Admitting: Podiatry

## 2021-04-26 ENCOUNTER — Ambulatory Visit (INDEPENDENT_AMBULATORY_CARE_PROVIDER_SITE_OTHER): Payer: Medicare Other | Admitting: Podiatry

## 2021-04-26 ENCOUNTER — Other Ambulatory Visit: Payer: Self-pay

## 2021-04-26 DIAGNOSIS — L600 Ingrowing nail: Secondary | ICD-10-CM

## 2021-04-26 DIAGNOSIS — I639 Cerebral infarction, unspecified: Secondary | ICD-10-CM | POA: Diagnosis not present

## 2021-04-26 NOTE — Patient Instructions (Signed)

## 2021-04-26 NOTE — Progress Notes (Signed)
Subjective:   Patient ID: Jeremy Klein, male   DOB: 80 y.o.   MRN: [id number]   HPI Patient presents stating that his right fifth nail is damaged partially loose and sore and he wants it removed.  States that he did traumatize it around 3 weeks ago.  Patient does not smoke currently likes to be active   Review of Systems  All other systems reviewed and are negative.      Objective:  Physical Exam Vitals and nursing note reviewed.  Constitutional:      Appearance: He is well-developed.  Pulmonary:     Effort: Pulmonary effort is normal.  Musculoskeletal:        General: Normal range of motion.  Skin:    General: Skin is warm.  Neurological:     Mental Status: He is alert.    Neurovascular status intact muscle strength adequate range of motion adequate with patient found to have a damaged right fifth nail that is partially loose detached and is discomforting when pressed.  No other pathology noted good digital perfusion     Assessment:  Chronic damage to the right fifth nail secondary to injury     Plan:  H&P reviewed condition recommended removal but not permanently.  I anesthetized 60 mg like Marcaine mixture sterile prep done using sterile instrumentation remove the nail flush the bed applied sterile dressing begin soaks reappoint to recheck may require permanent procedure in future if it grows back abnormally

## 2021-05-17 LAB — CUP PACEART REMOTE DEVICE CHECK
Date Time Interrogation Session: 20221101181620
Implantable Pulse Generator Implant Date: 20220202

## 2021-05-18 DIAGNOSIS — R361 Hematospermia: Secondary | ICD-10-CM | POA: Diagnosis not present

## 2021-05-18 DIAGNOSIS — N400 Enlarged prostate without lower urinary tract symptoms: Secondary | ICD-10-CM | POA: Diagnosis not present

## 2021-05-22 ENCOUNTER — Ambulatory Visit (INDEPENDENT_AMBULATORY_CARE_PROVIDER_SITE_OTHER): Payer: Medicare Other

## 2021-05-22 DIAGNOSIS — I639 Cerebral infarction, unspecified: Secondary | ICD-10-CM | POA: Diagnosis not present

## 2021-05-26 NOTE — Progress Notes (Signed)
Carelink Summary Report / Loop Recorder 

## 2021-06-11 ENCOUNTER — Other Ambulatory Visit: Payer: Self-pay | Admitting: Internal Medicine

## 2021-06-14 ENCOUNTER — Telehealth: Payer: Self-pay | Admitting: Internal Medicine

## 2021-06-14 MED ORDER — PANTOPRAZOLE SODIUM 40 MG PO TBEC: DELAYED_RELEASE_TABLET | ORAL | 0 refills | Status: DC

## 2021-06-14 NOTE — Telephone Encounter (Signed)
Patient called to schedule an OV for med refill has appt 06/23/21 but he said he only has two days left of Protonix.

## 2021-06-14 NOTE — Telephone Encounter (Signed)
1 refills sent to pharmacy. Patient must keep appointment for further refills.

## 2021-06-19 ENCOUNTER — Ambulatory Visit: Payer: Medicare Other | Admitting: Physician Assistant

## 2021-06-22 DIAGNOSIS — E538 Deficiency of other specified B group vitamins: Secondary | ICD-10-CM | POA: Insufficient documentation

## 2021-06-22 DIAGNOSIS — Z95818 Presence of other cardiac implants and grafts: Secondary | ICD-10-CM | POA: Insufficient documentation

## 2021-06-23 ENCOUNTER — Encounter: Payer: Self-pay | Admitting: Nurse Practitioner

## 2021-06-23 ENCOUNTER — Ambulatory Visit (INDEPENDENT_AMBULATORY_CARE_PROVIDER_SITE_OTHER): Payer: Medicare Other | Admitting: Nurse Practitioner

## 2021-06-23 DIAGNOSIS — K2980 Duodenitis without bleeding: Secondary | ICD-10-CM

## 2021-06-23 MED ORDER — PANTOPRAZOLE SODIUM 40 MG PO TBEC: DELAYED_RELEASE_TABLET | ORAL | 3 refills | Status: DC

## 2021-06-23 NOTE — Patient Instructions (Addendum)
If you are age 80 or older, your body mass index should be between 23-30. Your Body mass index is 29.36 kg/m. If this is out of the aforementioned range listed, please consider follow up with your Primary Care Provider.  If you are age 72 or younger, your body mass index should be between 19-25. Your Body mass index is 29.36 kg/m. If this is out of the aformentioned range listed, please consider follow up with your Primary Care Provider.   The [facility] providers would like to encourage you to use [facility] to communicate with providers for non-urgent requests or questions.  Due to long hold times on the telephone, sending your provider a message by [facility] may be faster and more efficient way to get a response. Please allow 48 business hours for a response.  Please remember that this is for non-urgent requests/questions.  Your medication has been refilled for a year. Please follow up in 1 year or sooner as needed.  It was great seeing you today! Thank you for entrusting me with your care and choosing [facility].  Noralyn Pick, CRNP

## 2021-06-23 NOTE — Progress Notes (Signed)
06/23/2021 Jeremy Klein [id number] 30-May-1941   Chief Complaint: Pantoprazole refill, GERD follow up  History of Present Illness: Jeremy Klein is an 80 year old male with a past medical history of hypertension, hyperlipidemia, nonobstructive coronary artery disease, MVP s/p MV repair, right cerebellar CVA (likely embolic) 03/7988 on Plavix, s/p loop recorder placement, GI bleed s/p EGD 10/2016 showed duodenitis with an ulcer and colon polyps. His most recent evaluation by  Dr. Hilarie Klein was a virtual visit during the [place] pandemic, 12/18/2018. At that time, he reported having epigastric burning for a few months with intermittent heartburn which resolved without recurrence. He presents to our office today for his annual follow up and to refill his Pantoprazole prescription. He is now living in a retirement facility and his meals are prepared for him. He reports eating less spicy foods and his reflux symptoms are well controlled. No further epigastric burning. He is passing a normal brown formed stool once daily. No rectal bleeding or black stools. He is on Plavix since he suffered from a CVA 07/2020. No other complaints at this time.   Most recent GI procedures:  EGD 11/02/2016: revealed patchy moderate inflammation of the gastric antrum without ulceration. This was biopsied. There were multiple small benign-appearing polyps in the gastric fundus and body. There was a polypoid lesion in the duodenal bulb with small clean-based ulceration. This was biopsied extensively. Pathology from the gastric polyp showed benign fundic gland polyps. Antral biopsies showed benign gastric mucosa negative for H. pylori, metaplasia or active inflammation. The duodenal nodule represented gastric metaplasia felt peptic in nature. There is no evidence of malignancy.   Colonoscopy 09/16/2019: - Three 2 to 5 mm polyps in the sigmoid colon, in the descending colon and in the ascending colon, removed with a cold snare.  Resected and retrieved. - Diverticulosis in the sigmoid colon and in the descending colon. - Small internal hemorrhoids. - Consider recall colonoscopy in 3 to 5 years - TUBULAR ADENOMA, NEGATIVE FOR HIGH GRADE DYSPLASIA (X3).   CBC Latest Ref Rng & Units 08/02/2020 08/02/2020 03/05/2019  WBC 4.0 - 10.5 K/uL - 7.7 5.6  Hemoglobin 13.0 - 17.0 g/dL 16.3 16.3 15.9  Hematocrit 39.0 - 52.0 % 48.0 49.2 45.6  Platelets 150 - 400 K/uL - 202 197    CMP Latest Ref Rng & Units 08/02/2020 08/02/2020 03/05/2019  Glucose 70 - 99 mg/dL 96 109(H) 100(H)  BUN 8 - 23 mg/dL 18 14 15   Creatinine 0.61 - 1.24 mg/dL 1.00 1.04 0.96  Sodium 135 - 145 mmol/L 138 136 139  Potassium 3.5 - 5.1 mmol/L 4.1 4.0 4.3  Chloride 98 - 111 mmol/L 101 102 102  CO2 22 - 32 mmol/L - 23 23  Calcium 8.9 - 10.3 mg/dL - 9.4 9.4  Total Protein 6.5 - 8.1 g/dL - 6.7 -  Total Bilirubin 0.3 - 1.2 mg/dL - 0.8 -  Alkaline Phos 38 - 126 U/L - 50 -  AST 15 - 41 U/L - 13(L) -  ALT 0 - 44 U/L - 14 -    Current Outpatient Medications on File Prior to Visit  Medication Sig Dispense Refill   amLODipine (NORVASC) 5 MG tablet TAKE 1 TABLET BY MOUTH DAILY 90 tablet 3   atorvastatin (LIPITOR) 40 MG tablet TAKE 1 TABLET(40 MG) BY MOUTH DAILY 90 tablet 3   clopidogrel (PLAVIX) 75 MG tablet Take 75 mg by mouth daily.     ezetimibe (ZETIA) 10 MG tablet TAKE 1  TABLET BY MOUTH DAILY 90 tablet 2   Multiple Vitamins-Minerals (ICAPS PO) Take by mouth. 2 by mouth in the am, 2 by mouth in the pm     Saw Palmetto 500 MG CAPS Take 1 capsule by mouth daily.     triamterene-hydrochlorothiazide (MAXZIDE-25) 37.5-25 MG tablet TAKE 1 TABLET BY MOUTH DAILY 90 tablet 2   vitamin B-12 (CYANOCOBALAMIN) 500 MCG tablet Take 500 mcg by mouth daily.     No current facility-administered medications on file prior to visit.   No Known Allergies   Current Medications, Allergies, Past Medical History, Past Surgical History, Family History and Social History were reviewed  in [employer] record.   Review of Systems:   Constitutional: Negative for fever, sweats, chills or weight loss.  Respiratory: Negative for shortness of breath.   Cardiovascular: Negative for chest pain, palpitations and leg swelling.  Gastrointestinal: See HPI.  Musculoskeletal: Negative for back pain or muscle aches.  Neurological: Negative for dizziness, headaches or paresthesias.    Physical Exam: BP 138/72   Pulse 70   Ht 6' (1.829 m)   Wt 216 lb 8 oz (98.2 kg)   BMI 29.36 kg/m  General: 80 year old male in no acute distress. Head: Normocephalic and atraumatic. Eyes: No scleral icterus. Conjunctiva pink . Ears: Normal auditory acuity. Mouth: Dentition intact. No ulcers or lesions.  Lungs: Clear throughout to auscultation. Heart: Regular rate and rhythm, no murmur. Abdomen: Soft, nontender and nondistended. No masses or hepatomegaly. Normal bowel sounds x 4 quadrants.  Rectal: Deferred Musculoskeletal: Symmetrical with no gross deformities. Extremities: No edema. Neurological: Alert oriented x 4. No focal deficits.  Psychological: Alert and cooperative. Normal mood and affect  Assessment and Recommendations:  83) 80 year old male with a history of GIB likely due duodenitis/duodenal ulcer 10/2016. Asymptomatic.  -Continue Pantoprazole 40 mg daily indefinitely  -GERD diet as tolerated  2) History of adenomatous colon polyps. Last colonoscopy was 09/16/2019 and 3 small tubular adenomatous polyps were removed from the colon. -Patient to follow-up with Dr. Hilarie Klein in one year to determine if any further colon polyp surveillance colonoscopies warranted as he is 80 years old at this time  3) CVA 07/2020 on Plavix, s/p loop recorder without arrhythmia identified

## 2021-06-26 ENCOUNTER — Ambulatory Visit (INDEPENDENT_AMBULATORY_CARE_PROVIDER_SITE_OTHER): Payer: Medicare Other

## 2021-06-26 DIAGNOSIS — I639 Cerebral infarction, unspecified: Secondary | ICD-10-CM | POA: Diagnosis not present

## 2021-06-26 NOTE — Progress Notes (Signed)
Addendum: Reviewed and agree with assessment and management plan. Azilee Pirro M, MD  

## 2021-06-27 DIAGNOSIS — Z6829 Body mass index (BMI) 29.0-29.9, adult: Secondary | ICD-10-CM | POA: Diagnosis not present

## 2021-06-27 DIAGNOSIS — R361 Hematospermia: Secondary | ICD-10-CM | POA: Diagnosis not present

## 2021-06-27 LAB — CUP PACEART REMOTE DEVICE CHECK
Date Time Interrogation Session: 20221204181833
Implantable Pulse Generator Implant Date: 20220202

## 2021-07-05 NOTE — Progress Notes (Signed)
Carelink Summary Report / Loop Recorder 

## 2021-07-24 ENCOUNTER — Other Ambulatory Visit: Payer: Self-pay

## 2021-07-24 ENCOUNTER — Other Ambulatory Visit: Payer: Self-pay | Admitting: Internal Medicine

## 2021-07-24 MED ORDER — ATORVASTATIN CALCIUM 40 MG PO TABS: ORAL_TABLET | ORAL | 0 refills | Status: DC

## 2021-07-24 MED ORDER — EZETIMIBE 10 MG PO TABS: 10.0000 mg | ORAL_TABLET | Freq: Every day | ORAL | 1 refills | Status: DC

## 2021-07-31 ENCOUNTER — Ambulatory Visit (INDEPENDENT_AMBULATORY_CARE_PROVIDER_SITE_OTHER): Payer: Medicare Other

## 2021-07-31 DIAGNOSIS — I639 Cerebral infarction, unspecified: Secondary | ICD-10-CM

## 2021-08-01 LAB — CUP PACEART REMOTE DEVICE CHECK
Date Time Interrogation Session: 20230115230625
Implantable Pulse Generator Implant Date: 20220202

## 2021-08-06 NOTE — Progress Notes (Signed)
Cardiology Office Note   Date:  08/07/2021   ID:  Jeremy Klein, DOB 1941/04/22, MRN [id number]  PCP:  Shon Baton, MD  Cardiologist:   [name], MD   F/U of MV dz andCAD     History of Present Illness: Jeremy Klein is a 81 y.o. male with a history of MVP  (s/p repair at [facility] in 2003), moderate CAD in 2003 of LAD and , HL   I saw the pt in November 2021    Since that time the pt had problems with speech    Work up with MRI revealed a small cerebellar CVA    The pt is on ASA and Plavix   He was seen by Beckie Salts last spring and  has a LINQ placed  The pt denies CP   Breathng is OK   No palpitations   No neuro complaints     Outpatient Medications Prior to Visit  Medication Sig Dispense Refill   amLODipine (NORVASC) 5 MG tablet TAKE 1 TABLET BY MOUTH DAILY 90 tablet 0   atorvastatin (LIPITOR) 40 MG tablet TAKE 1 TABLET(40 MG) BY MOUTH DAILY 90 tablet 0   clopidogrel (PLAVIX) 75 MG tablet Take 75 mg by mouth daily.     ezetimibe (ZETIA) 10 MG tablet Take 1 tablet (10 mg total) by mouth daily. 90 tablet 1   Multiple Vitamins-Minerals (ICAPS PO) Take by mouth. 2 by mouth in the am, 2 by mouth in the pm     pantoprazole (PROTONIX) 40 MG tablet TAKE 1 TABLET BY MOUTH DAILY 30 MINUTES BEFORE BREAKFAST 90 tablet 3   Saw Palmetto 500 MG CAPS Take 1 capsule by mouth daily.     triamterene-hydrochlorothiazide (MAXZIDE-25) 37.5-25 MG tablet TAKE 1 TABLET BY MOUTH DAILY 90 tablet 2   vitamin B-12 (CYANOCOBALAMIN) 500 MCG tablet Take 500 mcg by mouth daily.     No facility-administered medications prior to visit.     Allergies:   Patient has no known allergies.   Past Medical History:  Diagnosis Date   Cataract    Colonic polyp    tubular & hyperplasticDr Sharlett Iles   Coronary artery disease    Dr Harrington Challenger; not significant   Gastric nodule    with gastric metaplasia   Heart murmur    Hyperlipidemia    Hypertension    Low back pain    intermittent   Macular degeneration    Dr  Cordelia Pen, [facility]   Mitral valve prolapse    S/P  repair 2003   Tubular adenoma of colon     Past Surgical History:  Procedure Laterality Date   APPENDECTOMY     CARDIAC CATHETERIZATION  2003   COLONOSCOPY  07/18/2016   COLONOSCOPY W/ POLYPECTOMY  09/2012    Dr Fidela [facility]   MITRAL VALVE REPAIR  2004   Dr Amador Cunas, [facility]     Social History:  The patient  reports that he quit smoking about 43 years ago. He has never used smokeless tobacco. He reports current alcohol use of about 3.0 standard drinks per week. He reports that he does not use drugs.   Family History:  The patient's family history includes Glaucoma in his father; Heart attack (age of onset: 47) in his father; Hypertension (age of onset: 28) in his mother; Prostate cancer (age of onset: 66) in his father; Skin cancer in his father.    ROS:  Please see the history of present illness. All other systems are  reviewed and  Negative to the above problem except as noted.    PHYSICAL EXAM: VS:  BP 118/68    Pulse 63    Ht 6' (1.829 m)    Wt 214 lb 9.6 oz (97.3 kg)    SpO2 97%    BMI 29.10 kg/m   GEN: Well nourished, well developed, in no acute distress  HEENT: normal  Neck: no JVD, carotid bruits Cardiac: RRR; no murmurs,; no LE  edema  Respiratory:  clear to auscultation bilaterally GI: soft, nontender, nondistended, + BS  No hepatomegaly  MS: no deformity Moving all extremities   Skin: warm and dry, no rash Neuro:  Strength and sensation are intact Psych: euthymic mood, full affect   EKG:  EKG SR  63 bpm   Lipid Panel    Component Value Date/Time   CHOL 149 03/05/2019 0904   TRIG 86 03/05/2019 0904   TRIG 81 06/14/2006 1056   HDL 65 03/05/2019 0904   CHOLHDL 2.3 03/05/2019 0904   CHOLHDL 2.7 11/11/2015 0844   VLDL 23 11/11/2015 0844   LDLCALC 67 03/05/2019 0904      Wt Readings from Last 3 Encounters:  08/07/21 214 lb 9.6 oz (97.3 kg)  06/23/21 216 lb 8 oz (98.2 kg)  02/15/21 207 lb 9.6 oz (94.2 kg)       ASSESSMENT AND PLAN:  1  MV dz  Excellent repair in 2003 at [facility]  No murmur     2   CAD   Mod at cath in 2003   No symptoms of angina    3  HL   Last lipids in March 2022 LDL 59  HDL 7  Trig 106   HE is about to have labs drawn soon at [facility] office   4  HTN   Good control     5  Diet   Discussed low carb, low sugar diet    Keep active    F/U in 1 year   Signed, [name], MD  08/07/2021 9:42 PM    [address], [place], [place]  [id number] Phone: [phone]; Fax: [phone]

## 2021-08-07 ENCOUNTER — Encounter: Payer: Self-pay | Admitting: Internal Medicine

## 2021-08-07 ENCOUNTER — Ambulatory Visit (INDEPENDENT_AMBULATORY_CARE_PROVIDER_SITE_OTHER): Payer: Medicare Other | Admitting: Internal Medicine

## 2021-08-07 ENCOUNTER — Other Ambulatory Visit: Payer: Self-pay

## 2021-08-07 VITALS — BP 118/68 | HR 63 | Ht 72.0 in | Wt 214.6 lb

## 2021-08-07 DIAGNOSIS — I2510 Atherosclerotic heart disease of native coronary artery without angina pectoris: Secondary | ICD-10-CM | POA: Diagnosis not present

## 2021-08-07 NOTE — Patient Instructions (Signed)
Medication Instructions:  Your physician recommends that you continue on your current medications as directed. Please refer to the Current Medication list given to you today.  *If you need a refill on your cardiac medications before your next appointment, please call your pharmacy*   Lab Work: none If you have labs (blood work) drawn today and your tests are completely normal, you will receive your results only by: MyChart Message (if you have MyChart) OR A paper copy in the mail If you have any lab test that is abnormal or we need to change your treatment, we will call you to review the results.   Testing/Procedures: none   Follow-Up: At [facility], you and your health needs are our priority.  As part of our continuing mission to provide you with exceptional heart care, we have created designated Provider Care Teams.  These Care Teams include your primary Cardiologist (physician) and Advanced Practice Providers (APPs -  Physician Assistants and Nurse Practitioners) who all work together to provide you with the care you need, when you need it.  We recommend signing up for the patient portal called "MyChart".  Sign up information is provided on this After Visit Summary.  MyChart is used to connect with patients for Virtual Visits (Telemedicine).  Patients are able to view lab/test results, encounter notes, upcoming appointments, etc.  Non-urgent messages can be sent to your provider as well.   To learn more about what you can do with MyChart, go to [email or web address].    Your next appointment:   1 year(s)  The format for your next appointment:   In Person  Provider:   Paula Ross, MD     Other Instructions   

## 2021-08-10 NOTE — Progress Notes (Signed)
Carelink Summary Report / Loop Recorder 

## 2021-08-28 NOTE — Progress Notes (Signed)
NEUROLOGY FOLLOW UP OFFICE NOTE  Jeremy Klein [id number]  Assessment/Plan:   Right cerebellar stroke, likely embolic of unknown source Right trigeminal neuritis, resolved - suspect viral etiology Hypertension Hyperlipidemia   Secondary stroke prevention as managed by PCP: - Plavix 75mg  daily - Statin therapy.  LDL goal less than 70 - Glycemic control.  Hgb A1c goal less than 7 - Blood pressure control Follow up as needed     Subjective:  Jeremy Klein is an 81 year old right-handed male with CAD, MVP s/p repair, HTN, HLD, and macular degeneration who follows up for trigeminal anesthesia and cerebellar stroke   UPDATE: He had an MRI of brain with and without contrast on 11/22/2020 to follow up on facial numbness, which was personally reviewed which demonstrated persistent enhancement of the right trigeminal nerve.  Serum labs on 11/24/2020 included positive ANA with mildly elevated ds-DNA ab 11 but negative RNP ab, SM ab, SSA/SSB abs, and Scl-70 abs; sed rate 11, negative CRP, ACE 22, negative Lyme ab, TSH 1.35, and low B12 205.  He was advised to start OTC B12 1034mcg daily.  Facial numbness has resolved.  No dizziness or ataxia.     HISTORY: On 08/02/2020, he developed a generalized non-throbbing headache.  He never gets headaches.  He briefly reported getting words out.  He had a little runny nose, which isn't unusual but denies any fever, GI or flu-like symptoms .  He was advised to go to the ED by his PCP.  CT head personally reviewed showed no acute abnormalities.  He was discharged.  The headache localized to the right occipital region and slowly resolved.  A couple of days later, he noted numbness along the right side of his nose, upper lip and bridge of his mouth.   No visual disturbance, facial pain, facial weakness, tinnitus, change in hearing, dizziness, ataxia, or facial rash.  His PCP ordered an MRI of the brain with and without contrast performed on 08/11/2020 which  was personally reviewed and demonstrated an 8 mm acute/subacute right cerebellar infarct and as well as enhancement of the cisternal segment of the right trigeminal nerve.  Chronic changes such as remote microhemorrhages involving the left vertex and right occipital lobe and remote right basal gangli and bilateral cerebellar lacunar infarcts noted as well.  He was started on Plavix (he stopped ASA a couple of years prior due to GI bleed).  He was maintained on atorvastatin and Zetia.  2D echocardiogram was unremarkable.  He had a loop recorder implanted.  He is scheduled for carotid duplex today. He notices some improvement in the facial numbness.  He reports that about a 3 weeks prior to this episode, he had a bout of dizziness.  PAST MEDICAL HISTORY: Past Medical History:  Diagnosis Date   Cataract    Colonic polyp    tubular & hyperplasticDr Sharlett Iles   Coronary artery disease    Dr Harrington Challenger; not significant   Gastric nodule    with gastric metaplasia   Heart murmur    Hyperlipidemia    Hypertension    Low back pain    intermittent   Macular degeneration    Dr Cordelia Pen, [facility]   Mitral valve prolapse    S/P  repair 2003   Tubular adenoma of colon     MEDICATIONS: Current Outpatient Medications on File Prior to Visit  Medication Sig Dispense Refill   amLODipine (NORVASC) 5 MG tablet TAKE 1 TABLET BY MOUTH DAILY 90 tablet 0  atorvastatin (LIPITOR) 40 MG tablet TAKE 1 TABLET(40 MG) BY MOUTH DAILY 90 tablet 0   clopidogrel (PLAVIX) 75 MG tablet Take 75 mg by mouth daily.     ezetimibe (ZETIA) 10 MG tablet Take 1 tablet (10 mg total) by mouth daily. 90 tablet 1   Multiple Vitamins-Minerals (ICAPS PO) Take by mouth. 2 by mouth in the am, 2 by mouth in the pm     pantoprazole (PROTONIX) 40 MG tablet TAKE 1 TABLET BY MOUTH DAILY 30 MINUTES BEFORE BREAKFAST 90 tablet 3   Saw Palmetto 500 MG CAPS Take 1 capsule by mouth daily.     triamterene-hydrochlorothiazide (MAXZIDE-25) 37.5-25 MG tablet TAKE  1 TABLET BY MOUTH DAILY 90 tablet 2   vitamin B-12 (CYANOCOBALAMIN) 500 MCG tablet Take 500 mcg by mouth daily.     No current facility-administered medications on file prior to visit.    ALLERGIES: No Known Allergies  FAMILY HISTORY: Family History  Problem Relation Age of Onset   Heart attack Father 73   Prostate cancer Father 75   Skin cancer Father        Lip   Glaucoma Father    Hypertension Mother 51   Diabetes Neg Hx    Stroke Neg Hx    Colon cancer Neg Hx    Esophageal cancer Neg Hx    Stomach cancer Neg Hx    Rectal cancer Neg Hx    Colon polyps Neg Hx       Objective:  Blood pressure 132/60, pulse 72, height 6' (1.829 m), weight 214 lb 3.2 oz (97.2 kg), SpO2 97 %. General: No acute distress.  Patient appears well-groomed.   Head:  Normocephalic/atraumatic Eyes:  Fundi examined but not visualized Neck: supple, no paraspinal tenderness, full range of motion Heart:  Regular rate and rhythm Lungs:  Clear to auscultation bilaterally Back: No paraspinal tenderness Neurological Exam: alert and oriented to person, place, and time.  Speech fluent and not dysarthric, language intact.  CN II-XII intact. Bulk and tone normal, muscle strength 5/5 throughout.  Sensation to light touch intact.  Deep tendon reflexes 2+ throughout, toes downgoing.  Finger to nose testing intact.  Gait normal, Romberg negative.   Metta Clines, DO  CC: Shon Baton, MD

## 2021-08-29 ENCOUNTER — Ambulatory Visit (INDEPENDENT_AMBULATORY_CARE_PROVIDER_SITE_OTHER): Payer: Medicare Other | Admitting: Neurology

## 2021-08-29 ENCOUNTER — Other Ambulatory Visit: Payer: Self-pay

## 2021-08-29 ENCOUNTER — Encounter: Payer: Self-pay | Admitting: Neurology

## 2021-08-29 VITALS — BP 132/60 | HR 72 | Ht 72.0 in | Wt 214.2 lb

## 2021-08-29 DIAGNOSIS — E538 Deficiency of other specified B group vitamins: Secondary | ICD-10-CM

## 2021-08-29 DIAGNOSIS — I639 Cerebral infarction, unspecified: Secondary | ICD-10-CM

## 2021-08-29 DIAGNOSIS — G509 Disorder of trigeminal nerve, unspecified: Secondary | ICD-10-CM | POA: Diagnosis not present

## 2021-08-29 NOTE — Patient Instructions (Signed)
Continue plavix Continue cholesterol medication Blood pressure control Mediterranean diet Follow up as needed   Mediterranean Diet A Mediterranean diet refers to food and lifestyle choices that are based on the traditions of countries located on the [employer]. It focuses on eating more fruits, vegetables, whole grains, beans, nuts, seeds, and heart-healthy fats, and eating less dairy, meat, eggs, and processed foods with added sugar, salt, and fat. This way of eating has been shown to help prevent certain conditions and improve outcomes for people who have chronic diseases, like kidney disease and heart disease. What are tips for following this plan? Reading food labels Check the serving size of packaged foods. For foods such as rice and pasta, the serving size refers to the amount of cooked product, not dry. Check the total fat in packaged foods. Avoid foods that have saturated fat or trans fats. Check the ingredient list for added sugars, such as corn syrup. Shopping  Buy a variety of foods that offer a balanced diet, including: Fresh fruits and vegetables (produce). Grains, beans, nuts, and seeds. Some of these may be available in unpackaged forms or large amounts (in bulk). Fresh seafood. Poultry and eggs. Low-fat dairy products. Buy whole ingredients instead of prepackaged foods. Buy fresh fruits and vegetables in-season from local farmers markets. Buy plain frozen fruits and vegetables. If you do not have access to quality fresh seafood, buy precooked frozen shrimp or canned fish, such as tuna, salmon, or sardines. Stock your pantry so you always have certain foods on hand, such as olive oil, canned tuna, canned tomatoes, rice, pasta, and beans. Cooking Cook foods with extra-virgin olive oil instead of using butter or other vegetable oils. Have meat as a side dish, and have vegetables or grains as your main dish. This means having meat in small portions or adding small  amounts of meat to foods like pasta or stew. Use beans or vegetables instead of meat in common dishes like chili or lasagna. Experiment with different cooking methods. Try roasting, broiling, steaming, and sauting vegetables. Add frozen vegetables to soups, stews, pasta, or rice. Add nuts or seeds for added healthy fats and plant protein at each meal. You can add these to yogurt, salads, or vegetable dishes. Marinate fish or vegetables using olive oil, lemon juice, garlic, and fresh herbs. Meal planning Plan to eat one vegetarian meal one day each week. Try to work up to two vegetarian meals, if possible. Eat seafood two or more times a week. Have healthy snacks readily available, such as: Vegetable sticks with hummus. Greek yogurt. Fruit and nut trail mix. Eat balanced meals throughout the week. This includes: Fruit: 2-3 servings a day. Vegetables: 4-5 servings a day. Low-fat dairy: 2 servings a day. Fish, poultry, or lean meat: 1 serving a day. Beans and legumes: 2 or more servings a week. Nuts and seeds: 1-2 servings a day. Whole grains: 6-8 servings a day. Extra-virgin olive oil: 3-4 servings a day. Limit red meat and sweets to only a few servings a month. Lifestyle  Cook and eat meals together with your family, when possible. Drink enough fluid to keep your urine pale yellow. Be physically active every day. This includes: Aerobic exercise like running or swimming. Leisure activities like gardening, walking, or housework. Get 7-8 hours of sleep each night. If recommended by your health care provider, drink red wine in moderation. This means 1 glass a day for nonpregnant women and 2 glasses a day for men. A glass of wine equals 5  oz (150 mL). What foods should I eat? Fruits Apples. Apricots. Avocado. Berries. Bananas. Cherries. Dates. Figs. Grapes. Lemons. Melon. Oranges. Peaches. Plums. Pomegranate. Vegetables Artichokes. Beets. Broccoli. Cabbage. Carrots. Eggplant. Green  beans. Chard. Kale. Spinach. Onions. Leeks. Peas. Squash. Tomatoes. Peppers. Radishes. Grains Whole-grain pasta. Brown rice. Bulgur wheat. Polenta. Couscous. Whole-wheat bread. [place]. Meats and other proteins Beans. Almonds. Sunflower seeds. Pine nuts. Peanuts. [place]. Salmon. Scallops. Shrimp. [place]. Tilapia. Clams. Oysters. Eggs. Poultry without skin. Dairy Low-fat milk. Cheese. Greek yogurt. Fats and oils Extra-virgin olive oil. Avocado oil. Grapeseed oil. Beverages Water. Red wine. Herbal tea. Sweets and desserts Greek yogurt with honey. Baked apples. Poached pears. Trail mix. Seasonings and condiments Basil. Cilantro. Coriander. Cumin. Mint. Parsley. Sage. Rosemary. Tarragon. Garlic. Oregano. Thyme. Pepper. Balsamic vinegar. Tahini. Hummus. Tomato sauce. Olives. Mushrooms. The items listed above may not be a complete list of foods and beverages you can eat. Contact a dietitian for more information. What foods should I limit? This is a list of foods that should be eaten rarely or only on special occasions. Fruits Fruit canned in syrup. Vegetables Deep-fried potatoes (french fries). Grains Prepackaged pasta or rice dishes. Prepackaged cereal with added sugar. Prepackaged snacks with added sugar. Meats and other proteins Beef. Pork. Lamb. Poultry with skin. Hot dogs. Berniece Salines. Dairy Ice cream. Sour cream. Whole milk. Fats and oils Butter. Canola oil. Vegetable oil. Beef fat (tallow). Lard. Beverages Juice. Sugar-sweetened soft drinks. Beer. Liquor and spirits. Sweets and desserts Cookies. Cakes. Pies. Candy. Seasonings and condiments Mayonnaise. Pre-made sauces and marinades. The items listed above may not be a complete list of foods and beverages you should limit. Contact a dietitian for more information. Summary The Mediterranean diet includes both food and lifestyle choices. Eat a variety of fresh fruits and vegetables, beans, nuts, seeds, and whole grains. Limit the  amount of red meat and sweets that you eat. If recommended by your health care provider, drink red wine in moderation. This means 1 glass a day for nonpregnant women and 2 glasses a day for men. A glass of wine equals 5 oz (150 mL). This information is not intended to replace advice given to you by your health care provider. Make sure you discuss any questions you have with your health care provider. Document Revised: 08/07/2019 Document Reviewed: 06/04/2019 Elsevier Patient Education  2022 [employer].

## 2021-09-04 ENCOUNTER — Ambulatory Visit (INDEPENDENT_AMBULATORY_CARE_PROVIDER_SITE_OTHER): Payer: Medicare Other

## 2021-09-04 DIAGNOSIS — I639 Cerebral infarction, unspecified: Secondary | ICD-10-CM

## 2021-09-04 LAB — CUP PACEART REMOTE DEVICE CHECK
Date Time Interrogation Session: 20230217230509
Implantable Pulse Generator Implant Date: 20220202

## 2021-09-08 NOTE — Progress Notes (Signed)
Carelink Summary Report / Loop Recorder 

## 2021-10-05 DIAGNOSIS — I2510 Atherosclerotic heart disease of native coronary artery without angina pectoris: Secondary | ICD-10-CM | POA: Diagnosis not present

## 2021-10-05 DIAGNOSIS — E538 Deficiency of other specified B group vitamins: Secondary | ICD-10-CM | POA: Diagnosis not present

## 2021-10-05 DIAGNOSIS — E785 Hyperlipidemia, unspecified: Secondary | ICD-10-CM | POA: Diagnosis not present

## 2021-10-05 DIAGNOSIS — Z125 Encounter for screening for malignant neoplasm of prostate: Secondary | ICD-10-CM | POA: Diagnosis not present

## 2021-10-09 ENCOUNTER — Ambulatory Visit (INDEPENDENT_AMBULATORY_CARE_PROVIDER_SITE_OTHER): Payer: Medicare Other

## 2021-10-09 DIAGNOSIS — I639 Cerebral infarction, unspecified: Secondary | ICD-10-CM

## 2021-10-11 LAB — CUP PACEART REMOTE DEVICE CHECK
Date Time Interrogation Session: 20230326231119
Implantable Pulse Generator Implant Date: 20220202

## 2021-10-12 DIAGNOSIS — D692 Other nonthrombocytopenic purpura: Secondary | ICD-10-CM | POA: Diagnosis not present

## 2021-10-12 DIAGNOSIS — E663 Overweight: Secondary | ICD-10-CM | POA: Diagnosis not present

## 2021-10-12 DIAGNOSIS — E785 Hyperlipidemia, unspecified: Secondary | ICD-10-CM | POA: Diagnosis not present

## 2021-10-12 DIAGNOSIS — M25511 Pain in right shoulder: Secondary | ICD-10-CM | POA: Diagnosis not present

## 2021-10-12 DIAGNOSIS — I2510 Atherosclerotic heart disease of native coronary artery without angina pectoris: Secondary | ICD-10-CM | POA: Diagnosis not present

## 2021-10-12 DIAGNOSIS — I6990 Unspecified sequelae of unspecified cerebrovascular disease: Secondary | ICD-10-CM | POA: Diagnosis not present

## 2021-10-12 DIAGNOSIS — M7541 Impingement syndrome of right shoulder: Secondary | ICD-10-CM | POA: Diagnosis not present

## 2021-10-12 DIAGNOSIS — Z0000 Encounter for general adult medical examination without abnormal findings: Secondary | ICD-10-CM | POA: Diagnosis not present

## 2021-10-12 DIAGNOSIS — E538 Deficiency of other specified B group vitamins: Secondary | ICD-10-CM | POA: Diagnosis not present

## 2021-10-12 DIAGNOSIS — R82998 Other abnormal findings in urine: Secondary | ICD-10-CM | POA: Diagnosis not present

## 2021-10-12 DIAGNOSIS — Z1389 Encounter for screening for other disorder: Secondary | ICD-10-CM | POA: Diagnosis not present

## 2021-10-12 DIAGNOSIS — R209 Unspecified disturbances of skin sensation: Secondary | ICD-10-CM | POA: Diagnosis not present

## 2021-10-12 DIAGNOSIS — I119 Hypertensive heart disease without heart failure: Secondary | ICD-10-CM | POA: Diagnosis not present

## 2021-10-12 DIAGNOSIS — Z1331 Encounter for screening for depression: Secondary | ICD-10-CM | POA: Diagnosis not present

## 2021-10-12 DIAGNOSIS — G8929 Other chronic pain: Secondary | ICD-10-CM | POA: Diagnosis not present

## 2021-10-12 DIAGNOSIS — I341 Nonrheumatic mitral (valve) prolapse: Secondary | ICD-10-CM | POA: Diagnosis not present

## 2021-10-18 NOTE — Progress Notes (Signed)
Carelink Summary Report / Loop Recorder 

## 2021-10-27 ENCOUNTER — Other Ambulatory Visit: Payer: Self-pay

## 2021-10-27 MED ORDER — ATORVASTATIN CALCIUM 40 MG PO TABS: ORAL_TABLET | ORAL | 3 refills | Status: DC

## 2021-10-30 ENCOUNTER — Other Ambulatory Visit: Payer: Self-pay

## 2021-10-30 MED ORDER — AMLODIPINE BESYLATE 5 MG PO TABS: 5.0000 mg | ORAL_TABLET | Freq: Every day | ORAL | 0 refills | Status: DC

## 2021-11-13 ENCOUNTER — Ambulatory Visit (INDEPENDENT_AMBULATORY_CARE_PROVIDER_SITE_OTHER): Payer: Medicare Other

## 2021-11-13 DIAGNOSIS — I639 Cerebral infarction, unspecified: Secondary | ICD-10-CM

## 2021-11-13 LAB — CUP PACEART REMOTE DEVICE CHECK
Date Time Interrogation Session: 20230428230349
Implantable Pulse Generator Implant Date: 20220202

## 2021-11-15 DIAGNOSIS — N5201 Erectile dysfunction due to arterial insufficiency: Secondary | ICD-10-CM | POA: Diagnosis not present

## 2021-11-15 DIAGNOSIS — N400 Enlarged prostate without lower urinary tract symptoms: Secondary | ICD-10-CM | POA: Diagnosis not present

## 2021-11-27 NOTE — Progress Notes (Signed)
Carelink Summary Report / Loop Recorder 

## 2021-12-18 ENCOUNTER — Ambulatory Visit (INDEPENDENT_AMBULATORY_CARE_PROVIDER_SITE_OTHER): Payer: Medicare Other

## 2021-12-18 DIAGNOSIS — I639 Cerebral infarction, unspecified: Secondary | ICD-10-CM

## 2021-12-19 LAB — CUP PACEART REMOTE DEVICE CHECK
Date Time Interrogation Session: 20230531230655
Implantable Pulse Generator Implant Date: 20220202

## 2022-01-02 NOTE — Progress Notes (Signed)
Carelink Summary Report / Loop Recorder 

## 2022-01-20 LAB — CUP PACEART REMOTE DEVICE CHECK
Date Time Interrogation Session: 20230703230522
Implantable Pulse Generator Implant Date: 20220202

## 2022-01-22 ENCOUNTER — Ambulatory Visit (INDEPENDENT_AMBULATORY_CARE_PROVIDER_SITE_OTHER): Payer: Medicare Other

## 2022-01-22 DIAGNOSIS — I639 Cerebral infarction, unspecified: Secondary | ICD-10-CM

## 2022-01-24 ENCOUNTER — Other Ambulatory Visit: Payer: Self-pay | Admitting: Internal Medicine

## 2022-01-24 ENCOUNTER — Other Ambulatory Visit: Payer: Self-pay

## 2022-01-24 MED ORDER — EZETIMIBE 10 MG PO TABS: 10.0000 mg | ORAL_TABLET | Freq: Every day | ORAL | 1 refills | Status: DC

## 2022-01-30 DIAGNOSIS — L570 Actinic keratosis: Secondary | ICD-10-CM | POA: Diagnosis not present

## 2022-01-30 DIAGNOSIS — D1801 Hemangioma of skin and subcutaneous tissue: Secondary | ICD-10-CM | POA: Diagnosis not present

## 2022-01-30 DIAGNOSIS — L821 Other seborrheic keratosis: Secondary | ICD-10-CM | POA: Diagnosis not present

## 2022-01-30 DIAGNOSIS — D692 Other nonthrombocytopenic purpura: Secondary | ICD-10-CM | POA: Diagnosis not present

## 2022-01-30 DIAGNOSIS — C4441 Basal cell carcinoma of skin of scalp and neck: Secondary | ICD-10-CM | POA: Diagnosis not present

## 2022-01-30 DIAGNOSIS — D485 Neoplasm of uncertain behavior of skin: Secondary | ICD-10-CM | POA: Diagnosis not present

## 2022-01-30 DIAGNOSIS — D224 Melanocytic nevi of scalp and neck: Secondary | ICD-10-CM | POA: Diagnosis not present

## 2022-01-30 DIAGNOSIS — D225 Melanocytic nevi of trunk: Secondary | ICD-10-CM | POA: Diagnosis not present

## 2022-02-13 NOTE — Progress Notes (Signed)
Carelink Summary Report / Loop Recorder 

## 2022-02-15 DIAGNOSIS — H04123 Dry eye syndrome of bilateral lacrimal glands: Secondary | ICD-10-CM | POA: Diagnosis not present

## 2022-02-15 DIAGNOSIS — H2513 Age-related nuclear cataract, bilateral: Secondary | ICD-10-CM | POA: Diagnosis not present

## 2022-02-15 DIAGNOSIS — H43813 Vitreous degeneration, bilateral: Secondary | ICD-10-CM | POA: Diagnosis not present

## 2022-02-15 DIAGNOSIS — H353132 Nonexudative age-related macular degeneration, bilateral, intermediate dry stage: Secondary | ICD-10-CM | POA: Diagnosis not present

## 2022-02-19 ENCOUNTER — Ambulatory Visit (INDEPENDENT_AMBULATORY_CARE_PROVIDER_SITE_OTHER): Payer: Medicare Other

## 2022-02-19 DIAGNOSIS — I639 Cerebral infarction, unspecified: Secondary | ICD-10-CM

## 2022-02-19 LAB — CUP PACEART REMOTE DEVICE CHECK
Date Time Interrogation Session: 20230805231147
Implantable Pulse Generator Implant Date: 20220202

## 2022-03-14 DIAGNOSIS — C4441 Basal cell carcinoma of skin of scalp and neck: Secondary | ICD-10-CM | POA: Diagnosis not present

## 2022-03-14 DIAGNOSIS — Z85828 Personal history of other malignant neoplasm of skin: Secondary | ICD-10-CM | POA: Diagnosis not present

## 2022-03-15 ENCOUNTER — Other Ambulatory Visit: Payer: Self-pay

## 2022-03-15 MED ORDER — TRIAMTERENE-HCTZ 37.5-25 MG PO TABS: 1.0000 | ORAL_TABLET | Freq: Every day | ORAL | 1 refills | Status: DC

## 2022-03-28 NOTE — Progress Notes (Signed)
Carelink Summary Report / Loop Recorder 

## 2022-04-26 DIAGNOSIS — R209 Unspecified disturbances of skin sensation: Secondary | ICD-10-CM | POA: Diagnosis not present

## 2022-04-26 DIAGNOSIS — I341 Nonrheumatic mitral (valve) prolapse: Secondary | ICD-10-CM | POA: Diagnosis not present

## 2022-04-26 DIAGNOSIS — I6990 Unspecified sequelae of unspecified cerebrovascular disease: Secondary | ICD-10-CM | POA: Diagnosis not present

## 2022-04-26 DIAGNOSIS — I2510 Atherosclerotic heart disease of native coronary artery without angina pectoris: Secondary | ICD-10-CM | POA: Diagnosis not present

## 2022-04-26 DIAGNOSIS — M25511 Pain in right shoulder: Secondary | ICD-10-CM | POA: Diagnosis not present

## 2022-04-26 DIAGNOSIS — E785 Hyperlipidemia, unspecified: Secondary | ICD-10-CM | POA: Diagnosis not present

## 2022-04-26 DIAGNOSIS — D692 Other nonthrombocytopenic purpura: Secondary | ICD-10-CM | POA: Diagnosis not present

## 2022-04-26 DIAGNOSIS — I119 Hypertensive heart disease without heart failure: Secondary | ICD-10-CM | POA: Diagnosis not present

## 2022-04-26 DIAGNOSIS — G508 Other disorders of trigeminal nerve: Secondary | ICD-10-CM | POA: Diagnosis not present

## 2022-04-26 DIAGNOSIS — Z23 Encounter for immunization: Secondary | ICD-10-CM | POA: Diagnosis not present

## 2022-04-26 DIAGNOSIS — Z95818 Presence of other cardiac implants and grafts: Secondary | ICD-10-CM | POA: Diagnosis not present

## 2022-04-26 DIAGNOSIS — M545 Low back pain, unspecified: Secondary | ICD-10-CM | POA: Diagnosis not present

## 2022-04-26 LAB — CUP PACEART REMOTE DEVICE CHECK
Date Time Interrogation Session: 20231010231435
Implantable Pulse Generator Implant Date: 20220202

## 2022-04-30 ENCOUNTER — Ambulatory Visit (INDEPENDENT_AMBULATORY_CARE_PROVIDER_SITE_OTHER): Payer: Medicare Other

## 2022-04-30 DIAGNOSIS — I639 Cerebral infarction, unspecified: Secondary | ICD-10-CM | POA: Diagnosis not present

## 2022-05-02 DIAGNOSIS — M545 Low back pain, unspecified: Secondary | ICD-10-CM | POA: Diagnosis not present

## 2022-05-24 NOTE — Progress Notes (Signed)
Carelink Summary Report / Loop Recorder 

## 2022-05-30 ENCOUNTER — Other Ambulatory Visit (HOSPITAL_COMMUNITY): Payer: Self-pay

## 2022-05-30 ENCOUNTER — Telehealth: Payer: Self-pay | Admitting: Nurse Practitioner

## 2022-05-30 NOTE — Telephone Encounter (Signed)
Patient called states he receive a letter form is insurance company stating the his prior auth on his Protonix 40 mg will expire on 06/14/2022. States If he wants to continue with medication he needs to process a new prior [place]. Pleace call to advise.

## 2022-06-04 ENCOUNTER — Ambulatory Visit (INDEPENDENT_AMBULATORY_CARE_PROVIDER_SITE_OTHER): Payer: Medicare Other

## 2022-06-04 DIAGNOSIS — I639 Cerebral infarction, unspecified: Secondary | ICD-10-CM | POA: Diagnosis not present

## 2022-06-05 LAB — CUP PACEART REMOTE DEVICE CHECK
Date Time Interrogation Session: 20231119231429
Implantable Pulse Generator Implant Date: 20220202

## 2022-06-06 ENCOUNTER — Telehealth: Payer: Self-pay

## 2022-06-06 ENCOUNTER — Other Ambulatory Visit (HOSPITAL_COMMUNITY): Payer: Self-pay

## 2022-06-06 NOTE — Telephone Encounter (Signed)
PA has been submitted and approved.  

## 2022-06-06 NOTE — Telephone Encounter (Signed)
Pharmacy Patient Advocate Encounter   Prior Authorization for Pantoprazole 40 MG Tab has been approved.    PA# [id number] Effective dates: 05/07/22 through 05/17/23   Received notification from [facility] FEP that prior authorization for Pantoprazole 40 MG Tab is due for renewal    PA submitted on 06/06/22 Key [id number] Status is pending

## 2022-06-16 ENCOUNTER — Other Ambulatory Visit: Payer: Self-pay | Admitting: Nurse Practitioner

## 2022-07-10 ENCOUNTER — Ambulatory Visit (INDEPENDENT_AMBULATORY_CARE_PROVIDER_SITE_OTHER): Payer: Medicare Other

## 2022-07-10 DIAGNOSIS — I639 Cerebral infarction, unspecified: Secondary | ICD-10-CM

## 2022-07-10 LAB — CUP PACEART REMOTE DEVICE CHECK
Date Time Interrogation Session: 20231225231637
Implantable Pulse Generator Implant Date: 20220202

## 2022-07-12 NOTE — Progress Notes (Signed)
Carelink Summary Report / Loop Recorder 

## 2022-07-21 ENCOUNTER — Other Ambulatory Visit: Payer: Self-pay | Admitting: Internal Medicine

## 2022-07-24 ENCOUNTER — Other Ambulatory Visit: Payer: Self-pay | Admitting: *Deleted

## 2022-07-24 MED ORDER — AMLODIPINE BESYLATE 5 MG PO TABS: ORAL_TABLET | ORAL | 0 refills | Status: DC

## 2022-08-06 NOTE — Progress Notes (Signed)
Carelink Summary Report / Loop Recorder

## 2022-08-07 ENCOUNTER — Ambulatory Visit: Payer: Medicare Other | Admitting: Internal Medicine

## 2022-08-07 ENCOUNTER — Other Ambulatory Visit: Payer: Self-pay

## 2022-08-07 MED ORDER — EZETIMIBE 10 MG PO TABS: 10.0000 mg | ORAL_TABLET | Freq: Every day | ORAL | 0 refills | Status: DC

## 2022-08-07 NOTE — Addendum Note (Signed)
Addended by: Gaetano Net on: 08/07/2022 08:31 AM   Modules accepted: Orders

## 2022-08-13 ENCOUNTER — Ambulatory Visit: Payer: Medicare Other

## 2022-08-13 DIAGNOSIS — I639 Cerebral infarction, unspecified: Secondary | ICD-10-CM

## 2022-08-14 NOTE — Progress Notes (Signed)
Cardiology Office Note   Date:  08/18/2022   ID:  ZAKHARI FOGEL, DOB 18-Apr-1941, MRN [id number]  PCP:  Shon Baton, MD  Cardiologist:   [name], MD   F/U of MV dz andCAD     History of Present Illness: Jeremy Klein is a 82 y.o. male with a history of MVP  (s/p repair at [facility] in 2003), moderate CAD in 2003 of LAD, HL and syncope     In spring 2022 the  pt had problems withhis  speech  MRI revealed a small cerebellar CVA    The pt is on ASA and Plavix   He was seen by Beckie Salts last spring and  has a LINQ placed   Since then he has had no atrial fibrillation     I saw the pt in Jan 2023   Since seen he denies CP    The only time he has SOB is when he is walking back to his apt after dinner   (50 to 100 yards with steps)   He does not have SOB at other times doing the same walk at other times      Outpatient Medications Prior to Visit  Medication Sig Dispense Refill   amLODipine (NORVASC) 5 MG tablet TAKE 1 TABLET(5 MG) BY MOUTH DAILY. Please schedule yearly appointment for future refills. 2nd attempt. Thank you 15 tablet 0   amoxicillin (AMOXIL) 500 MG capsule Take 500 mg by mouth once. 4 capsules prior to dental procedures     atorvastatin (LIPITOR) 40 MG tablet TAKE 1 TABLET(40 MG) BY MOUTH DAILY 90 tablet 3   clopidogrel (PLAVIX) 75 MG tablet Take 75 mg by mouth daily.     ezetimibe (ZETIA) 10 MG tablet Take 1 tablet (10 mg total) by mouth daily. Please keep your upcoming appointment in February for future refills. 90 tablet 0   Multiple Vitamins-Minerals (ICAPS PO) Take by mouth. 2 by mouth in the am, 2 by mouth in the pm     pantoprazole (PROTONIX) 40 MG tablet TAKE 1 TABLET BY MOUTH DAILY 30 MINUTES BEFORE BREAKFAST 90 tablet 1   Saw Palmetto 500 MG CAPS Take 1 capsule by mouth daily.     triamterene-hydrochlorothiazide (MAXZIDE-25) 37.5-25 MG tablet Take 1 tablet by mouth daily. 90 tablet 1   vitamin B-12 (CYANOCOBALAMIN) 500 MCG tablet Take 500 mcg by mouth daily.      No facility-administered medications prior to visit.     Allergies:   Patient has no known allergies.   Past Medical History:  Diagnosis Date   Cataract    Colonic polyp    tubular & hyperplasticDr Sharlett Iles   Coronary artery disease    Dr Harrington Challenger; not significant   Gastric nodule    with gastric metaplasia   Heart murmur    Hyperlipidemia    Hypertension    Low back pain    intermittent   Macular degeneration    Dr Cordelia Pen, [facility]   Mitral valve prolapse    S/P  repair 2003   Tubular adenoma of colon     Past Surgical History:  Procedure Laterality Date   APPENDECTOMY     CARDIAC CATHETERIZATION  2003   COLONOSCOPY  07/18/2016   COLONOSCOPY W/ POLYPECTOMY  09/2012    Dr Fidela [facility]   MITRAL VALVE REPAIR  2004   Dr Amador Cunas, [facility]     Social History:  The patient  reports that he quit smoking about 44 years ago.  His smoking use included cigarettes. He has never used smokeless tobacco. He reports current alcohol use of about 3.0 standard drinks of alcohol per week. He reports that he does not use drugs.   Family History:  The patient's family history includes Glaucoma in his father; Heart attack (age of onset: 19) in his father; Hypertension (age of onset: 47) in his mother; Prostate cancer (age of onset: 68) in his father; Skin cancer in his father.    ROS:  Please see the history of present illness. All other systems are reviewed and  Negative to the above problem except as noted.    PHYSICAL EXAM: VS:  BP 124/76   Pulse 64   Ht 6' (1.829 m)   Wt 216 lb (98 kg)   SpO2 97%   BMI 29.29 kg/m   GEN: Well nourished, well developed, in no acute distress  HEENT: normal  Neck: no JVD, no carotid bruit Cardiac: RRR; no murmur ,; no LE  edema  Respiratory:  clear to auscultation bilaterally GI: soft, nontender, nondistended, + BS  No hepatomegaly  MS: no deformity Moving all extremities   Skin: warm and dry, no rash Neuro:  Strength and sensation are  intact Psych: euthymic mood, full affect   EKG:  EKG SR  63 bpm   Lipid Panel    Component Value Date/Time   CHOL 149 03/05/2019 0904   TRIG 86 03/05/2019 0904   TRIG 81 06/14/2006 1056   HDL 65 03/05/2019 0904   CHOLHDL 2.3 03/05/2019 0904   CHOLHDL 2.7 11/11/2015 0844   VLDL 23 11/11/2015 0844   LDLCALC 67 03/05/2019 0904      Wt Readings from Last 3 Encounters:  08/17/22 216 lb (98 kg)  08/29/21 214 lb 3.2 oz (97.2 kg)  08/07/21 214 lb 9.6 oz (97.3 kg)      ASSESSMENT AND PLAN:  1  MV dz  Excellent repair in 2003 at [facility]   2   CAD   Mod at cath in 2003  No CP   Does have some dyspnea with walking after dinner but not at other times   Follow   3  HL     LDL 69   HDL 52 in 2023    Due to get labs soon   4  HTN   BP is well controlled     5  Diet   Again, reviewed low carb, low sugar diet    Keep active    F/U in 1 year   Signed, [name], MD  08/18/2022 8:26 PM    [address], [place], [place]  [address] Phone: [phone]; Fax: [phone]

## 2022-08-15 LAB — CUP PACEART REMOTE DEVICE CHECK
Date Time Interrogation Session: 20240127230417
Implantable Pulse Generator Implant Date: 20220202

## 2022-08-17 ENCOUNTER — Ambulatory Visit: Payer: Medicare Other | Attending: Internal Medicine | Admitting: Internal Medicine

## 2022-08-17 ENCOUNTER — Encounter: Payer: Self-pay | Admitting: Internal Medicine

## 2022-08-17 VITALS — BP 124/76 | HR 64 | Ht 72.0 in | Wt 216.0 lb

## 2022-08-17 DIAGNOSIS — I2510 Atherosclerotic heart disease of native coronary artery without angina pectoris: Secondary | ICD-10-CM | POA: Diagnosis not present

## 2022-08-17 NOTE — Patient Instructions (Signed)
Medication Instructions:  Your physician recommends that you continue on your current medications as directed. Please refer to the Current Medication list given to you today. *If you need a refill on your cardiac medications before your next appointment, please call your pharmacy*  Lab Work: If you have labs (blood work) drawn today and your tests are completely normal, you will receive your results only by: [place] (if you have MyChart) OR A paper copy in the mail If you have any lab test that is abnormal or we need to change your treatment, we will call you to review the results.  Testing/Procedures: None ordered today.  Follow-Up: At [facility], you and your health needs are our priority.  As part of our continuing mission to provide you with exceptional heart care, we have created designated Provider Care Teams.  These Care Teams include your primary Cardiologist (physician) and Advanced Practice Providers (APPs -  Physician Assistants and Nurse Practitioners) who all work together to provide you with the care you need, when you need it.  We recommend signing up for the patient portal called "MyChart".  Sign up information is provided on this After Visit Summary.  MyChart is used to connect with patients for Virtual Visits (Telemedicine).  Patients are able to view lab/test results, encounter notes, upcoming appointments, etc.  Non-urgent messages can be sent to your provider as well.   To learn more about what you can do with MyChart, go to [email or web address].    Your next appointment:   1 year(s)  Provider:   [name], MD     Other Instructions

## 2022-08-31 ENCOUNTER — Other Ambulatory Visit: Payer: Self-pay | Admitting: Internal Medicine

## 2022-09-12 DIAGNOSIS — M7541 Impingement syndrome of right shoulder: Secondary | ICD-10-CM | POA: Diagnosis not present

## 2022-09-17 ENCOUNTER — Ambulatory Visit (INDEPENDENT_AMBULATORY_CARE_PROVIDER_SITE_OTHER): Payer: Medicare Other

## 2022-09-17 DIAGNOSIS — I639 Cerebral infarction, unspecified: Secondary | ICD-10-CM | POA: Diagnosis not present

## 2022-09-18 LAB — CUP PACEART REMOTE DEVICE CHECK
Date Time Interrogation Session: 20240303231806
Implantable Pulse Generator Implant Date: 20220202

## 2022-09-24 ENCOUNTER — Other Ambulatory Visit: Payer: Self-pay | Admitting: Internal Medicine

## 2022-09-28 NOTE — Progress Notes (Signed)
Carelink Summary Report / Loop Recorder 

## 2022-10-05 DIAGNOSIS — M5136 Other intervertebral disc degeneration, lumbar region: Secondary | ICD-10-CM | POA: Diagnosis not present

## 2022-10-08 DIAGNOSIS — R7989 Other specified abnormal findings of blood chemistry: Secondary | ICD-10-CM | POA: Diagnosis not present

## 2022-10-08 DIAGNOSIS — Z125 Encounter for screening for malignant neoplasm of prostate: Secondary | ICD-10-CM | POA: Diagnosis not present

## 2022-10-08 DIAGNOSIS — E538 Deficiency of other specified B group vitamins: Secondary | ICD-10-CM | POA: Diagnosis not present

## 2022-10-08 DIAGNOSIS — E785 Hyperlipidemia, unspecified: Secondary | ICD-10-CM | POA: Diagnosis not present

## 2022-10-08 DIAGNOSIS — K219 Gastro-esophageal reflux disease without esophagitis: Secondary | ICD-10-CM | POA: Diagnosis not present

## 2022-10-08 DIAGNOSIS — R82998 Other abnormal findings in urine: Secondary | ICD-10-CM | POA: Diagnosis not present

## 2022-10-18 DIAGNOSIS — M545 Low back pain, unspecified: Secondary | ICD-10-CM | POA: Diagnosis not present

## 2022-10-18 DIAGNOSIS — M5416 Radiculopathy, lumbar region: Secondary | ICD-10-CM | POA: Diagnosis not present

## 2022-10-19 DIAGNOSIS — I341 Nonrheumatic mitral (valve) prolapse: Secondary | ICD-10-CM | POA: Diagnosis not present

## 2022-10-19 DIAGNOSIS — E663 Overweight: Secondary | ICD-10-CM | POA: Diagnosis not present

## 2022-10-19 DIAGNOSIS — Z1331 Encounter for screening for depression: Secondary | ICD-10-CM | POA: Diagnosis not present

## 2022-10-19 DIAGNOSIS — E785 Hyperlipidemia, unspecified: Secondary | ICD-10-CM | POA: Diagnosis not present

## 2022-10-19 DIAGNOSIS — Z1339 Encounter for screening examination for other mental health and behavioral disorders: Secondary | ICD-10-CM | POA: Diagnosis not present

## 2022-10-19 DIAGNOSIS — Z0000 Encounter for general adult medical examination without abnormal findings: Secondary | ICD-10-CM | POA: Diagnosis not present

## 2022-10-19 DIAGNOSIS — G8929 Other chronic pain: Secondary | ICD-10-CM | POA: Diagnosis not present

## 2022-10-19 DIAGNOSIS — I6990 Unspecified sequelae of unspecified cerebrovascular disease: Secondary | ICD-10-CM | POA: Diagnosis not present

## 2022-10-19 DIAGNOSIS — R200 Anesthesia of skin: Secondary | ICD-10-CM | POA: Diagnosis not present

## 2022-10-19 DIAGNOSIS — D692 Other nonthrombocytopenic purpura: Secondary | ICD-10-CM | POA: Diagnosis not present

## 2022-10-19 DIAGNOSIS — I119 Hypertensive heart disease without heart failure: Secondary | ICD-10-CM | POA: Diagnosis not present

## 2022-10-19 DIAGNOSIS — I2510 Atherosclerotic heart disease of native coronary artery without angina pectoris: Secondary | ICD-10-CM | POA: Diagnosis not present

## 2022-10-19 DIAGNOSIS — M545 Low back pain, unspecified: Secondary | ICD-10-CM | POA: Diagnosis not present

## 2022-10-19 DIAGNOSIS — Z95818 Presence of other cardiac implants and grafts: Secondary | ICD-10-CM | POA: Diagnosis not present

## 2022-10-19 DIAGNOSIS — G508 Other disorders of trigeminal nerve: Secondary | ICD-10-CM | POA: Diagnosis not present

## 2022-10-22 ENCOUNTER — Ambulatory Visit (INDEPENDENT_AMBULATORY_CARE_PROVIDER_SITE_OTHER): Payer: Medicare Other

## 2022-10-22 DIAGNOSIS — I639 Cerebral infarction, unspecified: Secondary | ICD-10-CM | POA: Diagnosis not present

## 2022-10-22 LAB — CUP PACEART REMOTE DEVICE CHECK
Date Time Interrogation Session: 20240405231011
Implantable Pulse Generator Implant Date: 20220202

## 2022-10-23 DIAGNOSIS — M545 Low back pain, unspecified: Secondary | ICD-10-CM | POA: Diagnosis not present

## 2022-10-27 ENCOUNTER — Other Ambulatory Visit: Payer: Self-pay | Admitting: Internal Medicine

## 2022-10-29 NOTE — Progress Notes (Signed)
Carelink Summary Report / Loop Recorder 

## 2022-10-30 DIAGNOSIS — M47816 Spondylosis without myelopathy or radiculopathy, lumbar region: Secondary | ICD-10-CM | POA: Diagnosis not present

## 2022-11-08 DIAGNOSIS — Z23 Encounter for immunization: Secondary | ICD-10-CM | POA: Diagnosis not present

## 2022-11-13 DIAGNOSIS — I700 Atherosclerosis of aorta: Secondary | ICD-10-CM | POA: Diagnosis not present

## 2022-11-13 DIAGNOSIS — M48061 Spinal stenosis, lumbar region without neurogenic claudication: Secondary | ICD-10-CM | POA: Diagnosis not present

## 2022-11-13 DIAGNOSIS — M5416 Radiculopathy, lumbar region: Secondary | ICD-10-CM | POA: Diagnosis not present

## 2022-11-20 DIAGNOSIS — M5416 Radiculopathy, lumbar region: Secondary | ICD-10-CM | POA: Diagnosis not present

## 2022-11-22 LAB — CUP PACEART REMOTE DEVICE CHECK
Date Time Interrogation Session: 20240508231308
Implantable Pulse Generator Implant Date: 20220202

## 2022-11-26 ENCOUNTER — Ambulatory Visit (INDEPENDENT_AMBULATORY_CARE_PROVIDER_SITE_OTHER): Payer: Medicare Other

## 2022-11-26 DIAGNOSIS — I639 Cerebral infarction, unspecified: Secondary | ICD-10-CM | POA: Diagnosis not present

## 2022-11-28 ENCOUNTER — Other Ambulatory Visit: Payer: Self-pay | Admitting: Internal Medicine

## 2022-11-29 NOTE — Progress Notes (Signed)
Carelink Summary Report / Loop Recorder 

## 2022-12-14 ENCOUNTER — Encounter: Payer: Self-pay | Admitting: Internal Medicine

## 2022-12-15 ENCOUNTER — Other Ambulatory Visit: Payer: Self-pay | Admitting: Nurse Practitioner

## 2022-12-18 ENCOUNTER — Other Ambulatory Visit: Payer: Self-pay | Admitting: Nurse Practitioner

## 2022-12-24 ENCOUNTER — Ambulatory Visit (INDEPENDENT_AMBULATORY_CARE_PROVIDER_SITE_OTHER): Payer: Medicare Other

## 2022-12-24 DIAGNOSIS — I639 Cerebral infarction, unspecified: Secondary | ICD-10-CM | POA: Diagnosis not present

## 2022-12-24 NOTE — Progress Notes (Signed)
Carelink Summary Report / Loop Recorder 

## 2022-12-25 LAB — CUP PACEART REMOTE DEVICE CHECK
Date Time Interrogation Session: 20240610230608
Implantable Pulse Generator Implant Date: 20220202

## 2023-01-16 NOTE — Progress Notes (Signed)
Carelink Summary Report / Loop Recorder 

## 2023-01-28 ENCOUNTER — Ambulatory Visit (INDEPENDENT_AMBULATORY_CARE_PROVIDER_SITE_OTHER): Payer: Medicare Other

## 2023-01-28 DIAGNOSIS — I639 Cerebral infarction, unspecified: Secondary | ICD-10-CM | POA: Diagnosis not present

## 2023-01-29 LAB — CUP PACEART REMOTE DEVICE CHECK
Date Time Interrogation Session: 20240714231426
Implantable Pulse Generator Implant Date: 20220202

## 2023-02-13 NOTE — Progress Notes (Signed)
Carelink Summary Report / Loop Recorder 

## 2023-02-15 ENCOUNTER — Other Ambulatory Visit: Payer: Self-pay | Admitting: Nurse Practitioner

## 2023-02-15 ENCOUNTER — Other Ambulatory Visit: Payer: Self-pay | Admitting: Internal Medicine

## 2023-02-27 DIAGNOSIS — L218 Other seborrheic dermatitis: Secondary | ICD-10-CM | POA: Diagnosis not present

## 2023-02-27 DIAGNOSIS — D1801 Hemangioma of skin and subcutaneous tissue: Secondary | ICD-10-CM | POA: Diagnosis not present

## 2023-02-27 DIAGNOSIS — L821 Other seborrheic keratosis: Secondary | ICD-10-CM | POA: Diagnosis not present

## 2023-02-27 DIAGNOSIS — Z85828 Personal history of other malignant neoplasm of skin: Secondary | ICD-10-CM | POA: Diagnosis not present

## 2023-02-27 DIAGNOSIS — D2261 Melanocytic nevi of right upper limb, including shoulder: Secondary | ICD-10-CM | POA: Diagnosis not present

## 2023-02-27 DIAGNOSIS — D225 Melanocytic nevi of trunk: Secondary | ICD-10-CM | POA: Diagnosis not present

## 2023-02-27 DIAGNOSIS — L853 Xerosis cutis: Secondary | ICD-10-CM | POA: Diagnosis not present

## 2023-03-04 ENCOUNTER — Ambulatory Visit (INDEPENDENT_AMBULATORY_CARE_PROVIDER_SITE_OTHER): Payer: Medicare Other

## 2023-03-04 DIAGNOSIS — I639 Cerebral infarction, unspecified: Secondary | ICD-10-CM | POA: Diagnosis not present

## 2023-03-04 LAB — CUP PACEART REMOTE DEVICE CHECK
Date Time Interrogation Session: 20240818231627
Implantable Pulse Generator Implant Date: 20220202

## 2023-03-12 ENCOUNTER — Encounter: Payer: Self-pay | Admitting: Nurse Practitioner

## 2023-03-12 ENCOUNTER — Ambulatory Visit (INDEPENDENT_AMBULATORY_CARE_PROVIDER_SITE_OTHER): Payer: Medicare Other | Admitting: Nurse Practitioner

## 2023-03-12 VITALS — BP 120/70 | HR 81 | Ht 72.0 in | Wt 210.0 lb

## 2023-03-12 DIAGNOSIS — Z8719 Personal history of other diseases of the digestive system: Secondary | ICD-10-CM | POA: Diagnosis not present

## 2023-03-12 NOTE — Progress Notes (Signed)
Addendum: Reviewed and agree with assessment and management plan. Pyrtle, Jay M, MD  

## 2023-03-12 NOTE — Progress Notes (Signed)
03/12/2023 Jeremy Klein [id number] 08-Jun-1941   Chief Complaint: GERD follow up, Refill Pantoprazole   History of Present Illness: Jeremy Klein. Jeremy Klein is an 82 year old male with a past medical history of hypertension, hyperlipidemia, nonobstructive coronary artery disease, MVP s/p MV repair, right cerebellar CVA (likely embolic) 07/2020 on Plavix, s/p loop recorder placement, GERD, GI bleed s/p EGD 10/2016 showed duodenitis with an ulcer and tubular adenomatous colon polyps.  He is known by Dr. Rhea Belton. He presents today for his annual GI follow-up.  He rarely has any heartburn as he remains on Pantoprazole 40 mg once daily.  No dysphagia.  No upper or lower abdominal pain.  He is passing a normal brown formed stool daily.  No rectal bleeding or black stools.  His most recent colonoscopy was 09/16/2019 which identified three 2 to 5 mm tubular adenomatous polyps removed from the sigmoid, descending and ascending colon.  Diverticulosis and small internal hemorrhoids noted.  Repeat colonoscopy in 3 to 5 years was considered.  He does not wish to pursue any further colon polyp surveillance colonoscopies as he is approaching age 52.     Latest Ref Rng & Units 08/02/2020    4:40 PM 08/02/2020    1:44 PM 03/05/2019    9:04 AM  CMP  Glucose 70 - 99 mg/dL 96  409  811   BUN 8 - 23 mg/dL 18  14  15    Creatinine 0.61 - 1.24 mg/dL 9.14  7.82  9.56   Sodium 135 - 145 mmol/L 138  136  139   Potassium 3.5 - 5.1 mmol/L 4.1  4.0  4.3   Chloride 98 - 111 mmol/L 101  102  102   CO2 22 - 32 mmol/L  23  23   Calcium 8.9 - 10.3 mg/dL  9.4  9.4   Total Protein 6.5 - 8.1 g/dL  6.7    Total Bilirubin 0.3 - 1.2 mg/dL  0.8    Alkaline Phos 38 - 126 U/L  50    AST 15 - 41 U/L  13    ALT 0 - 44 U/L  14           Latest Ref Rng & Units 08/02/2020    4:40 PM 08/02/2020    1:44 PM 03/05/2019    9:04 AM  CBC  WBC 4.0 - 10.5 K/uL  7.7  5.6   Hemoglobin 13.0 - 17.0 g/dL 21.3  08.6  57.8   Hematocrit 39.0 - 52.0 %  48.0  49.2  45.6   Platelets 150 - 400 K/uL  202  197      Most recent GI procedures:   EGD 11/02/2016:  Revealed patchy moderate inflammation of the gastric antrum without ulceration. This was biopsied. There were multiple small benign-appearing polyps in the gastric fundus and body. There was a polypoid lesion in the duodenal bulb with small clean-based ulceration. This was biopsied extensively. Pathology from the gastric polyp showed benign fundic gland polyps. Antral biopsies showed benign gastric mucosa negative for H. pylori, metaplasia or active inflammation. The duodenal nodule represented gastric metaplasia felt peptic in nature. There is no evidence of malignancy.   Colonoscopy 09/16/2019: - Three 2 to 5 mm polyps in the sigmoid colon, in the descending colon and in the ascending colon, removed with a cold snare. Resected and retrieved. - Diverticulosis in the sigmoid colon and in the descending colon. - Small internal hemorrhoids. - Consider recall colonoscopy in 3 to  5 years - TUBULAR ADENOMA, NEGATIVE FOR HIGH GRADE DYSPLASIA (X3  Current Outpatient Medications on File Prior to Visit  Medication Sig Dispense Refill   amLODipine (NORVASC) 5 MG tablet TAKE 1 TABLET(5 MG) BY MOUTH DAILY 90 tablet 3   amoxicillin (AMOXIL) 500 MG capsule Take 500 mg by mouth once. 4 capsules prior to dental procedures     atorvastatin (LIPITOR) 40 MG tablet TAKE 1 TABLET(40 MG) BY MOUTH DAILY 90 tablet 3   clopidogrel (PLAVIX) 75 MG tablet Take 75 mg by mouth daily.     ezetimibe (ZETIA) 10 MG tablet TAKE 1 TABLET BY MOUTH EVERY DAY 90 tablet 3   Multiple Vitamins-Minerals (ICAPS PO) Take by mouth. 2 by mouth in the am, 2 by mouth in the pm     pantoprazole (PROTONIX) 40 MG tablet TAKE 1 TABLET BY MOUTH DAILY 30 MINUTES BEFORE BREAKFAST 60 tablet 0   Saw Palmetto 500 MG CAPS Take 1 capsule by mouth daily.     triamterene-hydrochlorothiazide (MAXZIDE-25) 37.5-25 MG tablet TAKE 1 TABLET BY MOUTH DAILY  90 tablet 1   vitamin B-12 (CYANOCOBALAMIN) 500 MCG tablet Take 500 mcg by mouth daily.     No current facility-administered medications on file prior to visit.   No Known Allergies  Current Medications, Allergies, Past Medical History, Past Surgical History, Family History and Social History were reviewed in [employer] record.  Review of Systems:   Constitutional: Negative for fever, sweats, chills or weight loss.  Respiratory: Negative for shortness of breath.   Cardiovascular: Negative for chest pain, palpitations and leg swelling.  Gastrointestinal: See HPI.  Musculoskeletal: Negative for back pain or muscle aches.  Neurological: Negative for dizziness, headaches or paresthesias.    Physical Exam: BP 120/70   Pulse 81   Ht 6' (1.829 m)   Wt 210 lb (95.3 kg)   BMI 28.48 kg/m  General: 82 year old male in no acute distress. Head: Normocephalic and atraumatic. Eyes: No scleral icterus. Conjunctiva pink . Ears: Normal auditory acuity. Mouth: Dentition intact. No ulcers or lesions.  Lungs: Clear throughout to auscultation. Heart: Regular rate and rhythm, no murmur. Abdomen: Soft, nontender and nondistended. No masses or hepatomegaly. Normal bowel sounds x 4 quadrants.  Rectal: Deferred.  Musculoskeletal: Symmetrical with no gross deformities. Extremities: No edema. Neurological: Alert oriented x 4. No focal deficits.  Psychological: Alert and cooperative. Normal mood and affect  Assessment and Recommendations:  82 year old male with a history of GERD and GIB likely due duodenitis/duodenal ulcer 10/2016.  Rare heartburn on Pantoprazole 40 mg daily.  No overt GI bleeding. -Continue Pantoprazole 40 mg daily indefinitely  -GERD diet as tolerated -Request copy of most recent CBC and CMP from Dr. Ferd Hibbs office   History of adenomatous colon polyps. Last colonoscopy was 09/16/2019 and 3 small tubular adenomatous polyps were removed from the colon.   -Patient is approaching age 57 and he does not wish to pursue any further colon polyp surveillance colonoscopies   3) CVA 07/2020 on Plavix

## 2023-03-12 NOTE — Patient Instructions (Addendum)
Continue Pantopraole daily- take 30 minutes before breakfast.  Due to recent changes in healthcare laws, you may see the results of your imaging and laboratory studies on MyChart before your provider has had a chance to review them.  We understand that in some cases there may be results that are confusing or concerning to you. Not all laboratory results come back in the same time frame and the provider may be waiting for multiple results in order to interpret others.  Please give [place] 48 hours in order for your provider to thoroughly review all the results before contacting the office for clarification of your results.    Thank you for trusting me with your gastrointestinal care!   Alcide Evener, CRNP

## 2023-03-15 NOTE — Progress Notes (Signed)
Carelink Summary Report / Loop Recorder 

## 2023-04-08 ENCOUNTER — Ambulatory Visit (INDEPENDENT_AMBULATORY_CARE_PROVIDER_SITE_OTHER): Payer: Medicare Other

## 2023-04-08 DIAGNOSIS — I639 Cerebral infarction, unspecified: Secondary | ICD-10-CM | POA: Diagnosis not present

## 2023-04-09 LAB — CUP PACEART REMOTE DEVICE CHECK
Date Time Interrogation Session: 20240920230425
Implantable Pulse Generator Implant Date: 20220202

## 2023-04-14 ENCOUNTER — Other Ambulatory Visit: Payer: Self-pay | Admitting: Nurse Practitioner

## 2023-04-16 ENCOUNTER — Telehealth: Payer: Self-pay | Admitting: Nurse Practitioner

## 2023-04-16 NOTE — Telephone Encounter (Signed)
Inbound call from patient, states prior auth for pantopraole expires in November, patient is wishing to get a prior auth started and approved before the previous one expires.

## 2023-04-23 DIAGNOSIS — M7541 Impingement syndrome of right shoulder: Secondary | ICD-10-CM | POA: Diagnosis not present

## 2023-04-23 NOTE — Progress Notes (Signed)
Carelink Summary Report / Loop Recorder 

## 2023-05-03 DIAGNOSIS — M545 Low back pain, unspecified: Secondary | ICD-10-CM | POA: Diagnosis not present

## 2023-05-03 DIAGNOSIS — E538 Deficiency of other specified B group vitamins: Secondary | ICD-10-CM | POA: Diagnosis not present

## 2023-05-03 DIAGNOSIS — I119 Hypertensive heart disease without heart failure: Secondary | ICD-10-CM | POA: Diagnosis not present

## 2023-05-03 DIAGNOSIS — D692 Other nonthrombocytopenic purpura: Secondary | ICD-10-CM | POA: Diagnosis not present

## 2023-05-03 DIAGNOSIS — I6990 Unspecified sequelae of unspecified cerebrovascular disease: Secondary | ICD-10-CM | POA: Diagnosis not present

## 2023-05-03 DIAGNOSIS — I341 Nonrheumatic mitral (valve) prolapse: Secondary | ICD-10-CM | POA: Diagnosis not present

## 2023-05-03 DIAGNOSIS — R361 Hematospermia: Secondary | ICD-10-CM | POA: Diagnosis not present

## 2023-05-03 DIAGNOSIS — M25511 Pain in right shoulder: Secondary | ICD-10-CM | POA: Diagnosis not present

## 2023-05-03 DIAGNOSIS — I2510 Atherosclerotic heart disease of native coronary artery without angina pectoris: Secondary | ICD-10-CM | POA: Diagnosis not present

## 2023-05-03 DIAGNOSIS — Z95818 Presence of other cardiac implants and grafts: Secondary | ICD-10-CM | POA: Diagnosis not present

## 2023-05-03 DIAGNOSIS — E785 Hyperlipidemia, unspecified: Secondary | ICD-10-CM | POA: Diagnosis not present

## 2023-05-04 LAB — COMPREHENSIVE METABOLIC PANEL WITH GFR: EGFR (Non-African Amer.): 81

## 2023-05-13 ENCOUNTER — Ambulatory Visit (INDEPENDENT_AMBULATORY_CARE_PROVIDER_SITE_OTHER): Payer: Medicare Other

## 2023-05-13 DIAGNOSIS — I639 Cerebral infarction, unspecified: Secondary | ICD-10-CM | POA: Diagnosis not present

## 2023-05-13 LAB — CUP PACEART REMOTE DEVICE CHECK
Date Time Interrogation Session: 20241027230311
Implantable Pulse Generator Implant Date: 20220202

## 2023-05-20 ENCOUNTER — Other Ambulatory Visit (HOSPITAL_COMMUNITY): Payer: Self-pay

## 2023-05-20 ENCOUNTER — Telehealth: Payer: Self-pay

## 2023-05-20 NOTE — Telephone Encounter (Signed)
Pharmacy Patient Advocate Encounter   Received notification from RX Request Messages that prior authorization for Pantoprazole 40 mg is required/requested.   Insurance verification completed.   The patient is insured through [facility] .   Per test claim: PA required; PA started via CoverMyMeds. KEY [id number] . Waiting for clinical questions to populate.

## 2023-05-20 NOTE — Telephone Encounter (Signed)
PA request has been Submitted. New Encounter created for follow up. For additional info see Pharmacy Prior Auth telephone encounter from 05/20/2023.

## 2023-06-03 NOTE — Progress Notes (Signed)
Carelink Summary Report / Loop Recorder 

## 2023-06-11 ENCOUNTER — Other Ambulatory Visit (HOSPITAL_COMMUNITY): Payer: Self-pay

## 2023-06-11 ENCOUNTER — Telehealth: Payer: Self-pay | Admitting: Pharmacy Technician

## 2023-06-11 NOTE — Telephone Encounter (Signed)
Pharmacy Patient Advocate Encounter  Received notification from [facility] that Prior Authorization for PANTORPAZOLE 40MG  has been APPROVED from 11.26.24 to 11.26.25. Ran test claim, Copay is $7.95. This test claim was processed through [facility]- copay amounts may vary at other pharmacies due to pharmacy/plan contracts, or as the patient moves through the different stages of their insurance plan.   PA #/Case ID/Reference #: [id number]

## 2023-06-11 NOTE — Telephone Encounter (Signed)
Pharmacy Patient Advocate Encounter   Received notification from CoverMyMeds that prior authorization for PANTORPAZOLE 40MG  is required/requested.   Insurance verification completed.   The patient is insured through [facility] .   Per test claim: PA required; PA submitted to above mentioned insurance via CoverMyMeds Key/confirmation #/EOC [facility] Status is pending

## 2023-06-17 ENCOUNTER — Ambulatory Visit (INDEPENDENT_AMBULATORY_CARE_PROVIDER_SITE_OTHER): Payer: Medicare Other

## 2023-06-17 DIAGNOSIS — I639 Cerebral infarction, unspecified: Secondary | ICD-10-CM | POA: Diagnosis not present

## 2023-06-17 LAB — CUP PACEART REMOTE DEVICE CHECK
Date Time Interrogation Session: 20241129230040
Implantable Pulse Generator Implant Date: 20220202

## 2023-07-22 ENCOUNTER — Ambulatory Visit (INDEPENDENT_AMBULATORY_CARE_PROVIDER_SITE_OTHER): Payer: Medicare Other

## 2023-07-22 DIAGNOSIS — I639 Cerebral infarction, unspecified: Secondary | ICD-10-CM

## 2023-07-23 LAB — CUP PACEART REMOTE DEVICE CHECK
Date Time Interrogation Session: 20250105230235
Implantable Pulse Generator Implant Date: 20220202

## 2023-08-02 ENCOUNTER — Other Ambulatory Visit: Payer: Self-pay | Admitting: Internal Medicine

## 2023-08-06 DIAGNOSIS — M25511 Pain in right shoulder: Secondary | ICD-10-CM | POA: Diagnosis not present

## 2023-08-13 DIAGNOSIS — M75111 Incomplete rotator cuff tear or rupture of right shoulder, not specified as traumatic: Secondary | ICD-10-CM | POA: Diagnosis not present

## 2023-08-18 NOTE — Progress Notes (Signed)
 Cardiology Office Note   Date:  08/20/2023   ID:  Jeremy Klein, DOB Sep 24, 1940, MRN [id number]  PCP:  Onita Rush, MD  Cardiologist:   Vina Gull, MD   F/U of MV dz andCAD     History of Present Illness: Jeremy Klein is a 83 y.o. male with a history of MVP  (s/p repair at [facility] in 2003), moderate CAD in 2003 of LAD, HL and syncope     2017  Myoview   no ischemia or scar   Spring  2022 the  pt had problems with his  speech  MRI revealed a small cerebellar CVA     2022 Echo  normal LVEF   No MR 2022  Carotid USN  Minimal plaquing  The pt is on ASA and Plavix, now Plavix He is followed by KANDICE Birmingham for [facility]      He was seen by KANDICE Birmingham last spring and  has a LINQ placed   Since then he has had no atrial fibrillation     Feb 2024  Noted some SOB with walking after dinnner   Not at other times      Since seen he has done OK When walks back after eating dinner he again notes  some SOB    Not at other times   No dizziness   No palpitations    Outpatient Medications Prior to Visit  Medication Sig Dispense Refill   amLODipine  (NORVASC ) 5 MG tablet TAKE 1 TABLET(5 MG) BY MOUTH DAILY 90 tablet 3   amoxicillin (AMOXIL) 500 MG capsule Take 500 mg by mouth once. 4 capsules prior to dental procedures     atorvastatin  (LIPITOR) 40 MG tablet TAKE 1 TABLET(40 MG) BY MOUTH DAILY 90 tablet 0   clopidogrel (PLAVIX) 75 MG tablet Take 75 mg by mouth daily.     ezetimibe  (ZETIA ) 10 MG tablet TAKE 1 TABLET BY MOUTH EVERY DAY 90 tablet 3   Multiple Vitamins-Minerals (ICAPS PO) Take by mouth. 2 by mouth in the am, 2 by mouth in the pm     pantoprazole  (PROTONIX ) 40 MG tablet TAKE 1 TABLET BY MOUTH DAILY 30 MINUTES BEFORE BREAKFAST 60 tablet 5   Saw Palmetto 500 MG CAPS Take 1 capsule by mouth daily.     triamterene -hydrochlorothiazide (MAXZIDE-25) 37.5-25 MG tablet TAKE 1 TABLET BY MOUTH DAILY 90 tablet 1   vitamin B-12 (CYANOCOBALAMIN ) 500 MCG tablet Take 500 mcg by mouth daily.     No  facility-administered medications prior to visit.     Allergies:   Patient has no known allergies.   Past Medical History:  Diagnosis Date   Cataract    Colonic polyp    tubular & hyperplasticDr Jakie   Coronary artery disease    Dr Gull; not significant   Gastric nodule    with gastric metaplasia   Heart murmur    Hyperlipidemia    Hypertension    Low back pain    intermittent   Macular degeneration    Dr Cheree, [facility]   Mitral valve prolapse    S/P  repair 2003   Tubular adenoma of colon     Past Surgical History:  Procedure Laterality Date   APPENDECTOMY     CARDIAC CATHETERIZATION  2003   COLONOSCOPY  07/18/2016   COLONOSCOPY W/ POLYPECTOMY  09/2012    Dr Jakie [facility]   MITRAL VALVE REPAIR  2004   Dr Florette, [facility]     Social History:  The patient  reports that he quit smoking about 45 years ago. His smoking use included cigarettes. He has never used smokeless tobacco. He reports current alcohol  use of about 3.0 standard drinks of alcohol  per week. He reports that he does not use drugs.   Family History:  The patient's family history includes Glaucoma in his father; Heart attack (age of onset: 91) in his father; Hypertension (age of onset: 36) in his mother; Prostate cancer (age of onset: 78) in his father; Skin cancer in his father.    ROS:  Please see the history of present illness. All other systems are reviewed and  Negative to the above problem except as noted.    PHYSICAL EXAM: VS:  BP 136/70   Pulse 62   Ht 6' (1.829 m)   Wt 210 lb 3.2 oz (95.3 kg)   SpO2 96%   BMI 28.51 kg/m   GEN: Well nourished, well developed, in no acute distress  HEENT: normal  Neck: no JVD, no carotid bruit Cardiac: RRR; no murmurs  no LE  edema  Respiratory:  clear to auscultation  GI: soft, nontender, no hepatomegaly   MS: no deformity Moving all extremities    EKG:  EKG SR  68 bpm  LVH   Lipid Panel    Component Value Date/Time   CHOL 149 03/05/2019  0904   TRIG 86 03/05/2019 0904   TRIG 81 06/14/2006 1056   HDL 65 03/05/2019 0904   CHOLHDL 2.3 03/05/2019 0904   CHOLHDL 2.7 11/11/2015 0844   VLDL 23 11/11/2015 0844   LDLCALC 67 03/05/2019 0904      Wt Readings from Last 3 Encounters:  08/20/23 210 lb 3.2 oz (95.3 kg)  03/12/23 210 lb (95.3 kg)  08/17/22 216 lb (98 kg)      ASSESSMENT AND PLAN:  1  MV dz  Excellent repair in 2003 at [facility]  Echo in 2022   No MR   No murmur on exam    2   CAD   Mod at cath in 2003  Normal myoview  in 2017    He is without CP   I do not think dysnea after dinner is an anginal equivalent   Follow    3  HL   Will repeat lipomed    4  HTN   BP is far   130s/   He does not want to change meds   Recomm increase activity    5  Metabolics   Reviewed diet    Recomm he increase activity, exercise    Keep active    F/U in 1 year   Signed, Vina Gull, MD  08/20/2023 9:29 AM    [facility] [address] [place], [place], [place]  [address] Phone: [phone]; Fax: [phone]

## 2023-08-20 ENCOUNTER — Ambulatory Visit: Payer: Medicare Other | Attending: Internal Medicine | Admitting: Internal Medicine

## 2023-08-20 ENCOUNTER — Encounter: Payer: Self-pay | Admitting: Internal Medicine

## 2023-08-20 VITALS — BP 136/70 | HR 62 | Ht 72.0 in | Wt 210.2 lb

## 2023-08-20 DIAGNOSIS — I2510 Atherosclerotic heart disease of native coronary artery without angina pectoris: Secondary | ICD-10-CM

## 2023-08-20 DIAGNOSIS — I119 Hypertensive heart disease without heart failure: Secondary | ICD-10-CM | POA: Diagnosis not present

## 2023-08-20 DIAGNOSIS — Z131 Encounter for screening for diabetes mellitus: Secondary | ICD-10-CM

## 2023-08-20 DIAGNOSIS — E782 Mixed hyperlipidemia: Secondary | ICD-10-CM | POA: Diagnosis not present

## 2023-08-20 DIAGNOSIS — I10 Essential (primary) hypertension: Secondary | ICD-10-CM | POA: Diagnosis not present

## 2023-08-20 NOTE — Patient Instructions (Signed)
 Medication Instructions:   *If you need a refill on your cardiac medications before your next appointment, please call your pharmacy*   Lab Work: NMR, HGBA1C, CBC, BMET  If you have labs (blood work) drawn today and your tests are completely normal, you will receive your results only by: MyChart Message (if you have MyChart) OR A paper copy in the mail If you have any lab test that is abnormal or we need to change your treatment, we will call you to review the results.   Testing/Procedures:    Follow-Up: At [facility], you and your health needs are our priority.  As part of our continuing mission to provide you with exceptional heart care, we have created designated Provider Care Teams.  These Care Teams include your primary Cardiologist (physician) and Advanced Practice Providers (APPs -  Physician Assistants and Nurse Practitioners) who all work together to provide you with the care you need, when you need it.  We recommend signing up for the patient portal called MyChart.  Sign up information is provided on this After Visit Summary.  MyChart is used to connect with patients for Virtual Visits (Telemedicine).  Patients are able to view lab/test results, encounter notes, upcoming appointments, etc.  Non-urgent messages can be sent to your provider as well.   To learn more about what you can do with MyChart, go to [email or web address].    Your next appointment:   1 year(s)  Provider:   Vina Gull, MD     Other Instructions

## 2023-08-26 ENCOUNTER — Ambulatory Visit (INDEPENDENT_AMBULATORY_CARE_PROVIDER_SITE_OTHER): Payer: Medicare Other

## 2023-08-26 DIAGNOSIS — I639 Cerebral infarction, unspecified: Secondary | ICD-10-CM

## 2023-08-26 LAB — CUP PACEART REMOTE DEVICE CHECK
Date Time Interrogation Session: 20250209230811
Implantable Pulse Generator Implant Date: 20220202

## 2023-08-27 DIAGNOSIS — I2510 Atherosclerotic heart disease of native coronary artery without angina pectoris: Secondary | ICD-10-CM | POA: Diagnosis not present

## 2023-08-27 DIAGNOSIS — E782 Mixed hyperlipidemia: Secondary | ICD-10-CM | POA: Diagnosis not present

## 2023-08-27 DIAGNOSIS — Z131 Encounter for screening for diabetes mellitus: Secondary | ICD-10-CM | POA: Diagnosis not present

## 2023-08-27 DIAGNOSIS — I10 Essential (primary) hypertension: Secondary | ICD-10-CM | POA: Diagnosis not present

## 2023-08-27 DIAGNOSIS — I119 Hypertensive heart disease without heart failure: Secondary | ICD-10-CM | POA: Diagnosis not present

## 2023-08-28 LAB — CBC
Hematocrit: 47 % (ref 37.5–51.0)
Hemoglobin: 15.5 g/dL (ref 13.0–17.7)
MCH: 30.1 pg (ref 26.6–33.0)
MCHC: 33 g/dL (ref 31.5–35.7)
MCV: 91 fL (ref 79–97)
Platelets: 188 10*3/uL (ref 150–450)
RBC: 5.15 x10E6/uL (ref 4.14–5.80)
RDW: 13.8 % (ref 11.6–15.4)
WBC: 5.9 10*3/uL (ref 3.4–10.8)

## 2023-08-28 LAB — BASIC METABOLIC PANEL
BUN/Creatinine Ratio: 16 (ref 10–24)
BUN: 14 mg/dL (ref 8–27)
CO2: 25 mmol/L (ref 20–29)
Calcium: 9.2 mg/dL (ref 8.6–10.2)
Chloride: 103 mmol/L (ref 96–106)
Creatinine, Ser: 0.85 mg/dL (ref 0.76–1.27)
Glucose: 90 mg/dL (ref 70–99)
Potassium: 4.5 mmol/L (ref 3.5–5.2)
Sodium: 142 mmol/L (ref 134–144)
eGFR: 87 mL/min/{1.73_m2} (ref >59)

## 2023-08-28 LAB — NMR, LIPOPROFILE
Cholesterol, Total: 147 mg/dL (ref 100–199)
HDL Particle Number: 39.9 umol/L (ref >=30.5)
HDL-C: 58 mg/dL (ref >39)
LDL Particle Number: 765 nmol/L (ref <1000)
LDL Size: 20 nm — ABNORMAL LOW (ref >20.5)
LDL-C (NIH Calc): 66 mg/dL (ref 0–99)
LP-IR Score: 47 — ABNORMAL HIGH (ref <=45)
Small LDL Particle Number: 562 nmol/L — ABNORMAL HIGH (ref <=527)
Triglycerides: 135 mg/dL (ref 0–149)

## 2023-08-28 LAB — HEMOGLOBIN A1C
Est. average glucose Bld gHb Est-mCnc: 126 mg/dL
Hgb A1c MFr Bld: 6 % — ABNORMAL HIGH (ref 4.8–5.6)

## 2023-08-30 ENCOUNTER — Other Ambulatory Visit: Payer: Self-pay | Admitting: Internal Medicine

## 2023-09-02 ENCOUNTER — Encounter: Payer: Self-pay | Admitting: Internal Medicine

## 2023-09-02 NOTE — Progress Notes (Signed)
 Carelink Summary Report / Loop Recorder

## 2023-09-02 NOTE — Addendum Note (Signed)
Addended by: Geralyn Flash D on: 09/02/2023 03:09 PM   Modules accepted: Orders

## 2023-09-03 ENCOUNTER — Telehealth: Payer: Self-pay

## 2023-09-03 ENCOUNTER — Encounter: Payer: Self-pay | Admitting: Internal Medicine

## 2023-09-03 MED ORDER — TRIAMTERENE-HCTZ 37.5-25 MG PO TABS: 0.5000 | ORAL_TABLET | Freq: Every day | ORAL | Status: AC

## 2023-09-03 NOTE — Telephone Encounter (Signed)
I spoke with the pt re: his labwork and he report s that he has bene mistakenly taking 1/2 of his Maxzide.. he says he has been doing this since he got it back in 2019... he did not realize when he comes in to update his med list.... his BP and kidney function have been good on this dose so I have asked him to keep it just the way it is and I will change his med list... he will keep an eye on his BP at home.

## 2023-09-03 NOTE — Telephone Encounter (Signed)
-----   Message from [place] sent at 09/03/2023  1:31 PM EST ----- CBC is normal Electrolytes and kidney function are normal Lipids are overall excellent Hgb A1C is mildly increased at 6   Reflects average blood sugars.  Prediabetic range Watch/limit carbs, stay active, especially after meals

## 2023-09-18 ENCOUNTER — Other Ambulatory Visit (HOSPITAL_COMMUNITY): Payer: Self-pay

## 2023-09-30 ENCOUNTER — Ambulatory Visit (INDEPENDENT_AMBULATORY_CARE_PROVIDER_SITE_OTHER): Payer: Medicare Other

## 2023-09-30 DIAGNOSIS — I639 Cerebral infarction, unspecified: Secondary | ICD-10-CM

## 2023-10-01 LAB — CUP PACEART REMOTE DEVICE CHECK
Date Time Interrogation Session: 20250316230337
Implantable Pulse Generator Implant Date: 20220202

## 2023-10-02 ENCOUNTER — Encounter: Payer: Self-pay | Admitting: Internal Medicine

## 2023-10-07 NOTE — Progress Notes (Signed)
 Carelink Summary Report / Loop Recorder

## 2023-10-07 NOTE — Telephone Encounter (Signed)
 Pharmacy Patient Advocate Encounter  Received notification from [facility] that Prior Authorization for Pantoprazole 40 mg dr tablets has been APPROVED from 06/11/2023 to 06/10/2024   PA #/Case ID/Reference #: [id number]

## 2023-10-13 DIAGNOSIS — M5416 Radiculopathy, lumbar region: Secondary | ICD-10-CM | POA: Diagnosis not present

## 2023-10-18 DIAGNOSIS — K219 Gastro-esophageal reflux disease without esophagitis: Secondary | ICD-10-CM | POA: Diagnosis not present

## 2023-10-18 DIAGNOSIS — I2510 Atherosclerotic heart disease of native coronary artery without angina pectoris: Secondary | ICD-10-CM | POA: Diagnosis not present

## 2023-10-18 DIAGNOSIS — Z125 Encounter for screening for malignant neoplasm of prostate: Secondary | ICD-10-CM | POA: Diagnosis not present

## 2023-10-18 DIAGNOSIS — E785 Hyperlipidemia, unspecified: Secondary | ICD-10-CM | POA: Diagnosis not present

## 2023-10-18 DIAGNOSIS — I10 Essential (primary) hypertension: Secondary | ICD-10-CM | POA: Diagnosis not present

## 2023-10-18 DIAGNOSIS — E538 Deficiency of other specified B group vitamins: Secondary | ICD-10-CM | POA: Diagnosis not present

## 2023-10-22 DIAGNOSIS — M5416 Radiculopathy, lumbar region: Secondary | ICD-10-CM | POA: Diagnosis not present

## 2023-10-25 DIAGNOSIS — E785 Hyperlipidemia, unspecified: Secondary | ICD-10-CM | POA: Diagnosis not present

## 2023-10-25 DIAGNOSIS — R82998 Other abnormal findings in urine: Secondary | ICD-10-CM | POA: Diagnosis not present

## 2023-10-25 DIAGNOSIS — I2510 Atherosclerotic heart disease of native coronary artery without angina pectoris: Secondary | ICD-10-CM | POA: Diagnosis not present

## 2023-10-25 DIAGNOSIS — M545 Low back pain, unspecified: Secondary | ICD-10-CM | POA: Diagnosis not present

## 2023-10-25 DIAGNOSIS — K635 Polyp of colon: Secondary | ICD-10-CM | POA: Diagnosis not present

## 2023-10-25 DIAGNOSIS — Z1339 Encounter for screening examination for other mental health and behavioral disorders: Secondary | ICD-10-CM | POA: Diagnosis not present

## 2023-10-25 DIAGNOSIS — I119 Hypertensive heart disease without heart failure: Secondary | ICD-10-CM | POA: Diagnosis not present

## 2023-10-25 DIAGNOSIS — Z0000 Encounter for general adult medical examination without abnormal findings: Secondary | ICD-10-CM | POA: Diagnosis not present

## 2023-10-25 DIAGNOSIS — G8929 Other chronic pain: Secondary | ICD-10-CM | POA: Diagnosis not present

## 2023-10-25 DIAGNOSIS — K219 Gastro-esophageal reflux disease without esophagitis: Secondary | ICD-10-CM | POA: Diagnosis not present

## 2023-10-25 DIAGNOSIS — Z1331 Encounter for screening for depression: Secondary | ICD-10-CM | POA: Diagnosis not present

## 2023-10-25 DIAGNOSIS — E663 Overweight: Secondary | ICD-10-CM | POA: Diagnosis not present

## 2023-10-25 DIAGNOSIS — R361 Hematospermia: Secondary | ICD-10-CM | POA: Diagnosis not present

## 2023-10-25 DIAGNOSIS — D692 Other nonthrombocytopenic purpura: Secondary | ICD-10-CM | POA: Diagnosis not present

## 2023-11-04 ENCOUNTER — Ambulatory Visit (INDEPENDENT_AMBULATORY_CARE_PROVIDER_SITE_OTHER): Payer: Medicare Other

## 2023-11-04 DIAGNOSIS — I639 Cerebral infarction, unspecified: Secondary | ICD-10-CM | POA: Diagnosis not present

## 2023-11-04 LAB — CUP PACEART REMOTE DEVICE CHECK
Date Time Interrogation Session: 20250420230329
Implantable Pulse Generator Implant Date: 20220202

## 2023-11-05 ENCOUNTER — Encounter: Payer: Self-pay | Admitting: Internal Medicine

## 2023-11-14 DIAGNOSIS — M4726 Other spondylosis with radiculopathy, lumbar region: Secondary | ICD-10-CM | POA: Diagnosis not present

## 2023-11-14 DIAGNOSIS — M5116 Intervertebral disc disorders with radiculopathy, lumbar region: Secondary | ICD-10-CM | POA: Diagnosis not present

## 2023-11-18 ENCOUNTER — Other Ambulatory Visit: Payer: Self-pay | Admitting: Internal Medicine

## 2023-11-20 DIAGNOSIS — M5417 Radiculopathy, lumbosacral region: Secondary | ICD-10-CM | POA: Diagnosis not present

## 2023-11-20 NOTE — Progress Notes (Signed)
 Carelink Summary Report / Loop Recorder

## 2023-11-20 NOTE — Addendum Note (Signed)
 Addended by: Edra Govern D on: 11/20/2023 01:26 PM   Modules accepted: Orders

## 2023-12-05 ENCOUNTER — Ambulatory Visit (INDEPENDENT_AMBULATORY_CARE_PROVIDER_SITE_OTHER)

## 2023-12-05 DIAGNOSIS — I639 Cerebral infarction, unspecified: Secondary | ICD-10-CM | POA: Diagnosis not present

## 2023-12-05 LAB — CUP PACEART REMOTE DEVICE CHECK
Date Time Interrogation Session: 20250521230719
Implantable Pulse Generator Implant Date: 20220202

## 2023-12-08 ENCOUNTER — Ambulatory Visit: Payer: Self-pay | Admitting: Internal Medicine

## 2023-12-18 DIAGNOSIS — M75111 Incomplete rotator cuff tear or rupture of right shoulder, not specified as traumatic: Secondary | ICD-10-CM | POA: Diagnosis not present

## 2023-12-25 NOTE — Progress Notes (Signed)
 Carelink Summary Report / Loop Recorder

## 2023-12-26 ENCOUNTER — Encounter: Payer: Self-pay | Admitting: Internal Medicine

## 2024-01-02 DIAGNOSIS — M4726 Other spondylosis with radiculopathy, lumbar region: Secondary | ICD-10-CM | POA: Diagnosis not present

## 2024-01-06 ENCOUNTER — Ambulatory Visit (INDEPENDENT_AMBULATORY_CARE_PROVIDER_SITE_OTHER)

## 2024-01-06 DIAGNOSIS — I639 Cerebral infarction, unspecified: Secondary | ICD-10-CM

## 2024-01-07 LAB — CUP PACEART REMOTE DEVICE CHECK
Date Time Interrogation Session: 20250622230642
Implantable Pulse Generator Implant Date: 20220202

## 2024-01-12 ENCOUNTER — Ambulatory Visit: Payer: Self-pay | Admitting: Internal Medicine

## 2024-01-22 NOTE — Progress Notes (Signed)
 Carelink Summary Report / Loop Recorder

## 2024-01-23 ENCOUNTER — Other Ambulatory Visit: Payer: Self-pay | Admitting: Internal Medicine

## 2024-02-06 ENCOUNTER — Ambulatory Visit

## 2024-02-06 DIAGNOSIS — I639 Cerebral infarction, unspecified: Secondary | ICD-10-CM

## 2024-02-06 LAB — CUP PACEART REMOTE DEVICE CHECK
Date Time Interrogation Session: 20250723230922
Implantable Pulse Generator Implant Date: 20220202

## 2024-02-09 ENCOUNTER — Ambulatory Visit: Payer: Self-pay | Admitting: Internal Medicine

## 2024-02-12 NOTE — Addendum Note (Signed)
 Addended by: TAWNI DRILLING D on: 02/12/2024 11:27 AM   Modules accepted: Orders

## 2024-02-12 NOTE — Progress Notes (Signed)
 Carelink Summary Report / Loop Recorder

## 2024-02-13 ENCOUNTER — Other Ambulatory Visit: Payer: Self-pay | Admitting: Internal Medicine

## 2024-02-23 ENCOUNTER — Other Ambulatory Visit: Payer: Self-pay | Admitting: Nurse Practitioner

## 2024-03-03 DIAGNOSIS — D485 Neoplasm of uncertain behavior of skin: Secondary | ICD-10-CM | POA: Diagnosis not present

## 2024-03-03 DIAGNOSIS — D0462 Carcinoma in situ of skin of left upper limb, including shoulder: Secondary | ICD-10-CM | POA: Diagnosis not present

## 2024-03-03 DIAGNOSIS — L821 Other seborrheic keratosis: Secondary | ICD-10-CM | POA: Diagnosis not present

## 2024-03-03 DIAGNOSIS — L218 Other seborrheic dermatitis: Secondary | ICD-10-CM | POA: Diagnosis not present

## 2024-03-03 DIAGNOSIS — L858 Other specified epidermal thickening: Secondary | ICD-10-CM | POA: Diagnosis not present

## 2024-03-03 DIAGNOSIS — D225 Melanocytic nevi of trunk: Secondary | ICD-10-CM | POA: Diagnosis not present

## 2024-03-03 DIAGNOSIS — D1801 Hemangioma of skin and subcutaneous tissue: Secondary | ICD-10-CM | POA: Diagnosis not present

## 2024-03-03 DIAGNOSIS — D692 Other nonthrombocytopenic purpura: Secondary | ICD-10-CM | POA: Diagnosis not present

## 2024-03-03 DIAGNOSIS — Z85828 Personal history of other malignant neoplasm of skin: Secondary | ICD-10-CM | POA: Diagnosis not present

## 2024-03-03 DIAGNOSIS — L812 Freckles: Secondary | ICD-10-CM | POA: Diagnosis not present

## 2024-03-05 DIAGNOSIS — M12811 Other specific arthropathies, not elsewhere classified, right shoulder: Secondary | ICD-10-CM | POA: Diagnosis not present

## 2024-03-09 ENCOUNTER — Ambulatory Visit

## 2024-03-09 DIAGNOSIS — I639 Cerebral infarction, unspecified: Secondary | ICD-10-CM

## 2024-03-10 LAB — CUP PACEART REMOTE DEVICE CHECK
Date Time Interrogation Session: 20250823230537
Implantable Pulse Generator Implant Date: 20220202

## 2024-03-11 ENCOUNTER — Ambulatory Visit: Payer: Self-pay | Admitting: Internal Medicine

## 2024-03-25 DIAGNOSIS — M12811 Other specific arthropathies, not elsewhere classified, right shoulder: Secondary | ICD-10-CM | POA: Diagnosis not present

## 2024-03-25 DIAGNOSIS — M6281 Muscle weakness (generalized): Secondary | ICD-10-CM | POA: Diagnosis not present

## 2024-03-25 DIAGNOSIS — G8929 Other chronic pain: Secondary | ICD-10-CM | POA: Diagnosis not present

## 2024-03-25 DIAGNOSIS — M25511 Pain in right shoulder: Secondary | ICD-10-CM | POA: Diagnosis not present

## 2024-04-01 DIAGNOSIS — M12811 Other specific arthropathies, not elsewhere classified, right shoulder: Secondary | ICD-10-CM | POA: Diagnosis not present

## 2024-04-01 DIAGNOSIS — G8929 Other chronic pain: Secondary | ICD-10-CM | POA: Diagnosis not present

## 2024-04-01 DIAGNOSIS — M25511 Pain in right shoulder: Secondary | ICD-10-CM | POA: Diagnosis not present

## 2024-04-01 DIAGNOSIS — M6281 Muscle weakness (generalized): Secondary | ICD-10-CM | POA: Diagnosis not present

## 2024-04-01 NOTE — Progress Notes (Signed)
 Remote Loop Recorder Transmission

## 2024-04-03 DIAGNOSIS — M6281 Muscle weakness (generalized): Secondary | ICD-10-CM | POA: Diagnosis not present

## 2024-04-03 DIAGNOSIS — M25511 Pain in right shoulder: Secondary | ICD-10-CM | POA: Diagnosis not present

## 2024-04-03 DIAGNOSIS — M12811 Other specific arthropathies, not elsewhere classified, right shoulder: Secondary | ICD-10-CM | POA: Diagnosis not present

## 2024-04-03 DIAGNOSIS — G8929 Other chronic pain: Secondary | ICD-10-CM | POA: Diagnosis not present

## 2024-04-06 ENCOUNTER — Other Ambulatory Visit (HOSPITAL_COMMUNITY): Payer: Self-pay

## 2024-04-06 ENCOUNTER — Telehealth: Payer: Self-pay

## 2024-04-06 NOTE — Telephone Encounter (Signed)
 Pharmacy Patient Advocate Encounter   Received notification from CoverMyMeds that prior authorization for Pantoprazole  40MG  dr tablets  is required/requested.   Insurance verification completed.   The patient is insured through [employer] .   Per test claim: Patient has not been seen in over a year and will need new office documentation on patient progress on medication for submission.

## 2024-04-08 DIAGNOSIS — M12811 Other specific arthropathies, not elsewhere classified, right shoulder: Secondary | ICD-10-CM | POA: Diagnosis not present

## 2024-04-08 DIAGNOSIS — G8929 Other chronic pain: Secondary | ICD-10-CM | POA: Diagnosis not present

## 2024-04-08 DIAGNOSIS — M25511 Pain in right shoulder: Secondary | ICD-10-CM | POA: Diagnosis not present

## 2024-04-08 DIAGNOSIS — M6281 Muscle weakness (generalized): Secondary | ICD-10-CM | POA: Diagnosis not present

## 2024-04-09 ENCOUNTER — Ambulatory Visit

## 2024-04-09 DIAGNOSIS — I639 Cerebral infarction, unspecified: Secondary | ICD-10-CM | POA: Diagnosis not present

## 2024-04-09 LAB — CUP PACEART REMOTE DEVICE CHECK
Date Time Interrogation Session: 20250924230719
Implantable Pulse Generator Implant Date: 20220202

## 2024-04-12 ENCOUNTER — Ambulatory Visit: Payer: Self-pay | Admitting: Internal Medicine

## 2024-04-13 NOTE — Progress Notes (Signed)
 Remote Loop Recorder Transmission

## 2024-04-15 DIAGNOSIS — G8929 Other chronic pain: Secondary | ICD-10-CM | POA: Diagnosis not present

## 2024-04-15 DIAGNOSIS — M12811 Other specific arthropathies, not elsewhere classified, right shoulder: Secondary | ICD-10-CM | POA: Diagnosis not present

## 2024-04-15 DIAGNOSIS — M6281 Muscle weakness (generalized): Secondary | ICD-10-CM | POA: Diagnosis not present

## 2024-04-15 DIAGNOSIS — M25511 Pain in right shoulder: Secondary | ICD-10-CM | POA: Diagnosis not present

## 2024-04-16 DIAGNOSIS — H02132 Senile ectropion of right lower eyelid: Secondary | ICD-10-CM | POA: Diagnosis not present

## 2024-04-16 DIAGNOSIS — H353132 Nonexudative age-related macular degeneration, bilateral, intermediate dry stage: Secondary | ICD-10-CM | POA: Diagnosis not present

## 2024-04-16 DIAGNOSIS — H2513 Age-related nuclear cataract, bilateral: Secondary | ICD-10-CM | POA: Diagnosis not present

## 2024-04-16 DIAGNOSIS — H02135 Senile ectropion of left lower eyelid: Secondary | ICD-10-CM | POA: Diagnosis not present

## 2024-04-16 DIAGNOSIS — H04123 Dry eye syndrome of bilateral lacrimal glands: Secondary | ICD-10-CM | POA: Diagnosis not present

## 2024-04-16 DIAGNOSIS — H43813 Vitreous degeneration, bilateral: Secondary | ICD-10-CM | POA: Diagnosis not present

## 2024-04-16 NOTE — Progress Notes (Signed)
 Remote Loop Recorder Transmission

## 2024-04-17 DIAGNOSIS — M6281 Muscle weakness (generalized): Secondary | ICD-10-CM | POA: Diagnosis not present

## 2024-04-17 DIAGNOSIS — G8929 Other chronic pain: Secondary | ICD-10-CM | POA: Diagnosis not present

## 2024-04-17 DIAGNOSIS — M12811 Other specific arthropathies, not elsewhere classified, right shoulder: Secondary | ICD-10-CM | POA: Diagnosis not present

## 2024-04-17 DIAGNOSIS — M25511 Pain in right shoulder: Secondary | ICD-10-CM | POA: Diagnosis not present

## 2024-04-22 DIAGNOSIS — M6281 Muscle weakness (generalized): Secondary | ICD-10-CM | POA: Diagnosis not present

## 2024-04-22 DIAGNOSIS — M12811 Other specific arthropathies, not elsewhere classified, right shoulder: Secondary | ICD-10-CM | POA: Diagnosis not present

## 2024-04-22 DIAGNOSIS — G8929 Other chronic pain: Secondary | ICD-10-CM | POA: Diagnosis not present

## 2024-04-22 DIAGNOSIS — M25511 Pain in right shoulder: Secondary | ICD-10-CM | POA: Diagnosis not present

## 2024-04-24 DIAGNOSIS — M6281 Muscle weakness (generalized): Secondary | ICD-10-CM | POA: Diagnosis not present

## 2024-04-24 DIAGNOSIS — G8929 Other chronic pain: Secondary | ICD-10-CM | POA: Diagnosis not present

## 2024-04-24 DIAGNOSIS — M12811 Other specific arthropathies, not elsewhere classified, right shoulder: Secondary | ICD-10-CM | POA: Diagnosis not present

## 2024-04-24 DIAGNOSIS — M25511 Pain in right shoulder: Secondary | ICD-10-CM | POA: Diagnosis not present

## 2024-05-08 DIAGNOSIS — E663 Overweight: Secondary | ICD-10-CM | POA: Diagnosis not present

## 2024-05-08 DIAGNOSIS — R209 Unspecified disturbances of skin sensation: Secondary | ICD-10-CM | POA: Diagnosis not present

## 2024-05-08 DIAGNOSIS — E538 Deficiency of other specified B group vitamins: Secondary | ICD-10-CM | POA: Diagnosis not present

## 2024-05-08 DIAGNOSIS — M545 Low back pain, unspecified: Secondary | ICD-10-CM | POA: Diagnosis not present

## 2024-05-08 DIAGNOSIS — R361 Hematospermia: Secondary | ICD-10-CM | POA: Diagnosis not present

## 2024-05-08 DIAGNOSIS — M25511 Pain in right shoulder: Secondary | ICD-10-CM | POA: Diagnosis not present

## 2024-05-08 DIAGNOSIS — I6990 Unspecified sequelae of unspecified cerebrovascular disease: Secondary | ICD-10-CM | POA: Diagnosis not present

## 2024-05-08 DIAGNOSIS — I119 Hypertensive heart disease without heart failure: Secondary | ICD-10-CM | POA: Diagnosis not present

## 2024-05-08 DIAGNOSIS — E785 Hyperlipidemia, unspecified: Secondary | ICD-10-CM | POA: Diagnosis not present

## 2024-05-08 DIAGNOSIS — K219 Gastro-esophageal reflux disease without esophagitis: Secondary | ICD-10-CM | POA: Diagnosis not present

## 2024-05-08 DIAGNOSIS — G8929 Other chronic pain: Secondary | ICD-10-CM | POA: Diagnosis not present

## 2024-05-08 DIAGNOSIS — I2510 Atherosclerotic heart disease of native coronary artery without angina pectoris: Secondary | ICD-10-CM | POA: Diagnosis not present

## 2024-05-11 ENCOUNTER — Ambulatory Visit (INDEPENDENT_AMBULATORY_CARE_PROVIDER_SITE_OTHER)

## 2024-05-11 DIAGNOSIS — I639 Cerebral infarction, unspecified: Secondary | ICD-10-CM

## 2024-05-11 LAB — CUP PACEART REMOTE DEVICE CHECK
Date Time Interrogation Session: 20251026230949
Implantable Pulse Generator Implant Date: 20220202

## 2024-05-14 ENCOUNTER — Ambulatory Visit: Payer: Self-pay | Admitting: Internal Medicine

## 2024-05-14 NOTE — Progress Notes (Signed)
 Remote Loop Recorder Transmission

## 2024-05-27 DIAGNOSIS — M25511 Pain in right shoulder: Secondary | ICD-10-CM | POA: Diagnosis not present

## 2024-05-27 DIAGNOSIS — M6281 Muscle weakness (generalized): Secondary | ICD-10-CM | POA: Diagnosis not present

## 2024-05-27 DIAGNOSIS — M12811 Other specific arthropathies, not elsewhere classified, right shoulder: Secondary | ICD-10-CM | POA: Diagnosis not present

## 2024-05-27 DIAGNOSIS — G8929 Other chronic pain: Secondary | ICD-10-CM | POA: Diagnosis not present

## 2024-06-05 DIAGNOSIS — M12811 Other specific arthropathies, not elsewhere classified, right shoulder: Secondary | ICD-10-CM | POA: Diagnosis not present

## 2024-06-05 DIAGNOSIS — M6281 Muscle weakness (generalized): Secondary | ICD-10-CM | POA: Diagnosis not present

## 2024-06-05 DIAGNOSIS — G8929 Other chronic pain: Secondary | ICD-10-CM | POA: Diagnosis not present

## 2024-06-05 DIAGNOSIS — M25511 Pain in right shoulder: Secondary | ICD-10-CM | POA: Diagnosis not present

## 2024-06-10 ENCOUNTER — Other Ambulatory Visit: Payer: Self-pay | Admitting: Nurse Practitioner

## 2024-06-11 ENCOUNTER — Encounter

## 2024-06-13 ENCOUNTER — Ambulatory Visit: Attending: Internal Medicine

## 2024-06-13 DIAGNOSIS — I639 Cerebral infarction, unspecified: Secondary | ICD-10-CM | POA: Diagnosis not present

## 2024-06-15 LAB — CUP PACEART REMOTE DEVICE CHECK
Date Time Interrogation Session: 20251128230710
Implantable Pulse Generator Implant Date: 20220202

## 2024-06-17 NOTE — Progress Notes (Signed)
 Remote Loop Recorder Transmission

## 2024-06-18 ENCOUNTER — Ambulatory Visit: Payer: Self-pay | Admitting: Internal Medicine

## 2024-06-19 ENCOUNTER — Telehealth: Payer: Self-pay | Admitting: Nurse Practitioner

## 2024-06-19 NOTE — Telephone Encounter (Signed)
 Inbound call from patient stating that he is needing a refill on his medication Pantoprazole . Please advise.

## 2024-06-21 ENCOUNTER — Other Ambulatory Visit: Payer: Self-pay | Admitting: Nurse Practitioner

## 2024-06-22 NOTE — Telephone Encounter (Signed)
 Cara Elida HERO, NP to Me     06/22/24  8:22 AM Alan, if this is not a duplicate request okay to refill Pantoprazole  40 mg once daily to be taken 30 minutes before breakfast #90 with 1 additional refill.  Thank you.

## 2024-06-22 NOTE — Telephone Encounter (Signed)
 Refill sent to patients pharmacy.

## 2024-06-23 ENCOUNTER — Other Ambulatory Visit (HOSPITAL_COMMUNITY): Payer: Self-pay

## 2024-06-23 NOTE — Telephone Encounter (Signed)
 Patient has not been seen in office for over a year and will require an office visit for submission on new prior authorization.

## 2024-06-23 NOTE — Telephone Encounter (Signed)
Patient calling in regards to previous note. Please advise.

## 2024-06-23 NOTE — Telephone Encounter (Signed)
 Message sent to prior auth team. Please disregard.   Thank you

## 2024-06-23 NOTE — Telephone Encounter (Signed)
 Patient states insurance is requesting f/u call in regurds to needing PA for pantoprazole .   Please advise at [phone]

## 2024-06-24 NOTE — Telephone Encounter (Signed)
 Explained to patient that his insurance company is requiring a office visit for his PA renewal since it's been over a year since he has been seen by our office. Patient agreed to make an appointment but would prefer MyChart video visit if possible. Patient stated he will have to call me back, he is trying to find out if his wife is going to the hospital. Patient states he will call back to schedule an appt.

## 2024-06-25 NOTE — Telephone Encounter (Signed)
 PT has been scheduled an OV for 08/06/24.

## 2024-07-02 NOTE — Telephone Encounter (Signed)
 PT returning call. Has been out of this medication for two weeks and highly upset. He has scheduled an OV for 08/06/2024 and wants to have us  approve it with the insurance immediately. Please advise.

## 2024-07-02 NOTE — Telephone Encounter (Signed)
 Explained to patient that it's not us  requiring an office visit. Unfortunately, the insurance company is requiring before an office appt every year. Patient states he is out of medication and would like to be seen in the office sooner. Rescheduled appt with Ellouise Console, PA on 07/13/24. Informed patient he can take something over the counter until his appt. Patient verbalized understanding.

## 2024-07-12 NOTE — Progress Notes (Unsigned)
 "     Jeremy Console, PA-C [address] [place], [place]  [address] Phone: [phone]   Primary Care Physician: Onita Rush, MD  Primary Gastroenterologist:  Jeremy Console, PA-C / Dr. Gordy Starch   Chief Complaint: Follow-up GERD; History of Duodenal Ulcer; Refill Pantoprazole       HPI:   Discussed the use of AI scribe software for clinical note transcription with the patient, who gave verbal consent to proceed.  83 year old male returns for annual follow-up of GERD and History of bleeding Duodenal Ulcer.  He takes pantoprazole  40mg  daily since 2018 with good response.  Needs medication refill.  He denies any GI symptoms such as this abdominal pain, melena, hematochezia, weight loss, heartburn, or dysphagia.  He would like to continue the medication.  No GI concerns today.  PMH:  hypertension, hyperlipidemia, nonobstructive coronary artery disease, MVP s/p MV repair, right cerebellar CVA (likely embolic) 07/2020 on Plavix, s/p loop recorder placement, GERD, GI bleed s/p EGD 10/2016 showed duodenitis with an ulcer and tubular adenomatous colon polyps.   EGD 11/02/2016:  Revealed patchy moderate inflammation of the gastric antrum without ulceration. This was biopsied. There were multiple small benign-appearing polyps in the gastric fundus and body. There was a polypoid lesion in the duodenal bulb with small clean-based ulceration. This was biopsied extensively. Pathology from the gastric polyp showed benign fundic gland polyps. Antral biopsies showed benign gastric mucosa negative for H. pylori, metaplasia or active inflammation. The duodenal nodule represented gastric metaplasia felt peptic in nature. There is no evidence of malignancy.   Colonoscopy 09/16/2019: - Three 2 to 5 mm polyps in the sigmoid colon, in the descending colon and in the ascending colon, removed with a cold snare. Resected and retrieved. - Diverticulosis in the sigmoid colon and in the descending colon. - Small  internal hemorrhoids. - Consider recall colonoscopy in 3 to 5 years - TUBULAR ADENOMA, NEGATIVE FOR HIGH GRADE DYSPLASIA (X3 - No repeat colonoscopy recommended due to advanced age.   Current Outpatient Medications  Medication Sig Dispense Refill   amLODipine  (NORVASC ) 5 MG tablet TAKE 1 TABLET(5 MG) BY MOUTH DAILY 90 tablet 1   atorvastatin  (LIPITOR) 40 MG tablet TAKE 1 TABLET(40 MG) BY MOUTH DAILY 90 tablet 2   clopidogrel (PLAVIX) 75 MG tablet Take 75 mg by mouth daily.     ezetimibe  (ZETIA ) 10 MG tablet TAKE 1 TABLET BY MOUTH EVERY DAY 90 tablet 2   Multiple Vitamins-Minerals (ICAPS PO) Take by mouth. 2 by mouth in the am, 2 by mouth in the pm     Saw Palmetto 500 MG CAPS Take 1 capsule by mouth daily.     triamterene -hydrochlorothiazide (MAXZIDE-25) 37.5-25 MG tablet Take 0.5 tablets by mouth daily.     vitamin B-12 (CYANOCOBALAMIN ) 500 MCG tablet Take 500 mcg by mouth daily.     amoxicillin (AMOXIL) 500 MG capsule Take 500 mg by mouth once. 4 capsules prior to dental procedures (Patient not taking: Reported on 07/13/2024)     pantoprazole  (PROTONIX ) 40 MG tablet TAKE 1 TABLET BY MOUTH DAILY 30 MINUTES BEFORE BREAKFAST 90 tablet 3   No current facility-administered medications for this visit.    Allergies as of 07/13/2024   (No Known Allergies)    Past Medical History:  Diagnosis Date   Cataract    Colonic polyp    tubular & hyperplasticDr Jakie   Coronary artery disease    Dr Okey; not significant   Gastric nodule    with gastric  metaplasia   Heart murmur    Hyperlipidemia    Hypertension    Low back pain    intermittent   Macular degeneration    Dr Cheree, [facility]   Mitral valve prolapse    S/P  repair 2003   Tubular adenoma of colon     Past Surgical History:  Procedure Laterality Date   APPENDECTOMY     CARDIAC CATHETERIZATION  2003   COLONOSCOPY  07/18/2016   COLONOSCOPY W/ POLYPECTOMY  09/2012    Dr Jakie [facility]   MITRAL VALVE REPAIR  2004   Dr  Florette, [facility]    Review of Systems:    All systems reviewed and negative except where noted in HPI.    Physical Exam:  BP 124/70   Pulse 72   Ht 6' (1.829 m)   Wt 211 lb (95.7 kg)   BMI 28.62 kg/m  No LMP for male patient.  General: Well-nourished, well-developed in no acute distress.  Lungs: Clear to auscultation bilaterally. Non-labored. Heart: Regular rate and rhythm, no murmurs rubs or gallops.  Abdomen: Bowel sounds are normal; Abdomen is Soft; No hepatosplenomegaly, masses or hernias;  No Abdominal Tenderness; No guarding or rebound tenderness. Neuro: Alert and oriented x 3.  Grossly intact.  Psych: Alert and cooperative, normal mood and affect.   Imaging Studies: No results found.   Labs: CBC    Component Value Date/Time   WBC 5.9 08/27/2023 0806   WBC 7.7 08/02/2020 1344   RBC 5.15 08/27/2023 0806   RBC 5.40 08/02/2020 1344   HGB 15.5 08/27/2023 0806   HCT 47.0 08/27/2023 0806   PLT 188 08/27/2023 0806   MCV 91 08/27/2023 0806   MCH 30.1 08/27/2023 0806   MCH 30.2 08/02/2020 1344   MCHC 33.0 08/27/2023 0806   MCHC 33.1 08/02/2020 1344   RDW 13.8 08/27/2023 0806   LYMPHSABS 1.1 08/02/2020 1344   MONOABS 0.8 08/02/2020 1344   EOSABS 0.0 08/02/2020 1344   BASOSABS 0.0 08/02/2020 1344    CMP     Component Value Date/Time   NA 142 08/27/2023 0806   K 4.5 08/27/2023 0806   CL 103 08/27/2023 0806   CO2 25 08/27/2023 0806   GLUCOSE 90 08/27/2023 0806   GLUCOSE 96 08/02/2020 1640   GLUCOSE 89 06/14/2006 1056   BUN 14 08/27/2023 0806   CREATININE 0.85 08/27/2023 0806   CALCIUM  9.2 08/27/2023 0806   PROT 6.7 08/02/2020 1344   ALBUMIN 4.1 08/02/2020 1344   AST 13 (L) 08/02/2020 1344   ALT 14 08/02/2020 1344   ALKPHOS 50 08/02/2020 1344   BILITOT 0.8 08/02/2020 1344   GFRNONAA 81 05/04/2023 0840   GFRAA 88 03/05/2019 0904    Assessment and Plan:   Jeremy Klein is a 83 y.o. y/o male returns for follow-up of: Assessment & Plan History of  Upper GI bleed from Duodenal ulcer with gastritis in 2018.  GERD. Asymptomatic and well-controlled on PPI.  No active or recurrent gastrointestinal disease. - Refill pantoprazole  40 mg 1 tablet once daily, #90, 3 refills. - Continue to avoid NSAIDs. - Primary care provider may refill pantoprazole  in the future.  - Annual follow-up required for medication refills, video visits optional if stable. - Return for evaluation if new or concerning gastrointestinal symptoms develop.  2.  History of adenomatous colon polyps - No further surveillance colonoscopies recommended due to advanced age.  Patient agrees with this recommendation.   Jeremy Console, PA-C  Follow up as needed  based on GI symptoms.   "

## 2024-07-13 ENCOUNTER — Ambulatory Visit: Admitting: Physician Assistant

## 2024-07-13 ENCOUNTER — Encounter: Payer: Self-pay | Admitting: Physician Assistant

## 2024-07-13 ENCOUNTER — Encounter

## 2024-07-13 VITALS — BP 124/70 | HR 72 | Ht 72.0 in | Wt 211.0 lb

## 2024-07-13 DIAGNOSIS — Z8719 Personal history of other diseases of the digestive system: Secondary | ICD-10-CM | POA: Diagnosis not present

## 2024-07-13 DIAGNOSIS — Z860101 Personal history of adenomatous and serrated colon polyps: Secondary | ICD-10-CM

## 2024-07-13 DIAGNOSIS — K219 Gastro-esophageal reflux disease without esophagitis: Secondary | ICD-10-CM | POA: Diagnosis not present

## 2024-07-13 DIAGNOSIS — Z8711 Personal history of peptic ulcer disease: Secondary | ICD-10-CM | POA: Diagnosis not present

## 2024-07-13 DIAGNOSIS — K2980 Duodenitis without bleeding: Secondary | ICD-10-CM

## 2024-07-13 MED ORDER — PANTOPRAZOLE SODIUM 40 MG PO TBEC: DELAYED_RELEASE_TABLET | ORAL | 3 refills | Status: AC

## 2024-07-13 NOTE — Patient Instructions (Addendum)
 We have sent the following medications to your pharmacy for you to pick up at your convenience: Pantoprazole  40 mg once daily before breakfast  Please follow up sooner if symptoms increase or worsen  Due to recent changes in healthcare laws, you may see the results of your imaging and laboratory studies on MyChart before your provider has had a chance to review them.  We understand that in some cases there may be results that are confusing or concerning to you. Not all laboratory results come back in the same time frame and the provider may be waiting for multiple results in order to interpret others.  Please give us  48 hours in order for your provider to thoroughly review all the results before contacting the office for clarification of your results.   Thank you for trusting me with your gastrointestinal care!   Ellouise Console, PA-C _______________________________________________________  If your blood pressure at your visit was 140/90 or greater, please contact your primary care physician to follow up on this.  _______________________________________________________  If you are age 67 or older, your body mass index should be between 23-30. Your Body mass index is 28.62 kg/m. If this is out of the aforementioned range listed, please consider follow up with your Primary Care Provider.  If you are age 53 or younger, your body mass index should be between 19-25. Your Body mass index is 28.62 kg/m. If this is out of the aformentioned range listed, please consider follow up with your Primary Care Provider.   ________________________________________________________  The [facility] providers would like to encourage you to use MYCHART to communicate with providers for non-urgent requests or questions.  Due to long hold times on the telephone, sending your provider a message by [facility] may be a faster and more efficient way to get a response.  Please allow 48 business hours for a response.  Please  remember that this is for non-urgent requests.  _______________________________________________________

## 2024-07-14 ENCOUNTER — Ambulatory Visit

## 2024-07-14 ENCOUNTER — Other Ambulatory Visit: Payer: Self-pay

## 2024-07-14 ENCOUNTER — Other Ambulatory Visit (HOSPITAL_COMMUNITY): Payer: Self-pay

## 2024-07-14 DIAGNOSIS — I639 Cerebral infarction, unspecified: Secondary | ICD-10-CM

## 2024-07-15 ENCOUNTER — Telehealth: Payer: Self-pay

## 2024-07-15 ENCOUNTER — Other Ambulatory Visit (HOSPITAL_COMMUNITY): Payer: Self-pay

## 2024-07-15 LAB — CUP PACEART REMOTE DEVICE CHECK
Date Time Interrogation Session: 20251229230532
Implantable Pulse Generator Implant Date: 20220202

## 2024-07-15 NOTE — Progress Notes (Signed)
 Remote Loop Recorder Transmission

## 2024-07-15 NOTE — Telephone Encounter (Signed)
 PA request has been Approved. New Encounter has been or will be created for follow up. For additional info see Pharmacy Prior Auth telephone encounter from 07-15-2024.

## 2024-07-15 NOTE — Telephone Encounter (Signed)
 Pharmacy Patient Advocate Encounter   Received notification from Patient Advice Request messages that prior authorization for Pantoprazole  Sodium 40MG  dr tablets is required/requested.   Insurance verification completed.   The patient is insured through [facility].   Prior Authorization for Pantoprazole  Sodium 40MG  dr tablets has been APPROVED from 07-14-2024 to 07-14-2025. Ran test claim, Copay is $11.93 for 90 day supply. This test claim was processed through [facility]- copay amounts may vary at other pharmacies due to pharmacy/plan contracts, or as the patient moves through the different stages of their insurance plan.   PA #/Case ID/Reference #: [id number]

## 2024-07-17 NOTE — Telephone Encounter (Signed)
 Patient informed via Mychart of approval.

## 2024-07-18 ENCOUNTER — Ambulatory Visit: Payer: Self-pay | Admitting: Cardiology

## 2024-08-04 ENCOUNTER — Other Ambulatory Visit: Payer: Self-pay | Admitting: Internal Medicine

## 2024-08-06 ENCOUNTER — Ambulatory Visit: Admitting: Nurse Practitioner

## 2024-08-13 ENCOUNTER — Encounter

## 2024-08-14 ENCOUNTER — Ambulatory Visit: Payer: Self-pay | Admitting: Cardiology

## 2024-08-14 ENCOUNTER — Ambulatory Visit: Attending: Cardiology

## 2024-08-14 DIAGNOSIS — I639 Cerebral infarction, unspecified: Secondary | ICD-10-CM | POA: Diagnosis not present

## 2024-08-14 LAB — CUP PACEART REMOTE DEVICE CHECK
Date Time Interrogation Session: 20260129230456
Implantable Pulse Generator Implant Date: 20220202

## 2024-08-18 ENCOUNTER — Other Ambulatory Visit: Payer: Self-pay | Admitting: Internal Medicine

## 2024-08-18 NOTE — Progress Notes (Signed)
 Remote Loop Recorder Transmission

## 2024-09-14 ENCOUNTER — Encounter

## 2024-09-14 ENCOUNTER — Ambulatory Visit

## 2024-10-15 ENCOUNTER — Ambulatory Visit

## 2024-10-15 ENCOUNTER — Encounter

## 2024-11-15 ENCOUNTER — Ambulatory Visit

## 2024-11-16 ENCOUNTER — Encounter

## 2024-12-16 ENCOUNTER — Ambulatory Visit

## 2025-01-16 ENCOUNTER — Ambulatory Visit

## 2025-02-16 ENCOUNTER — Ambulatory Visit

## 2025-03-19 ENCOUNTER — Ambulatory Visit

## 2025-04-19 ENCOUNTER — Ambulatory Visit

## 2025-05-20 ENCOUNTER — Ambulatory Visit

## 2025-06-20 ENCOUNTER — Ambulatory Visit

## 2025-07-21 ENCOUNTER — Ambulatory Visit
